# Patient Record
Sex: Female | Born: 1985 | Race: Black or African American | Hispanic: No | Marital: Single | Smoking: Never smoker
Health system: Southern US, Community
[De-identification: ages and names within clinical notes are randomized; demographics above are authoritative.]

## PROBLEM LIST (undated history)

## (undated) DIAGNOSIS — D649 Anemia, unspecified: Secondary | ICD-10-CM

## (undated) HISTORY — PX: APPENDECTOMY: SHX54

---

## 2005-11-03 ENCOUNTER — Emergency Department (HOSPITAL_COMMUNITY): Admission: EM | Admit: 2005-11-03 | Discharge: 2005-11-03 | Payer: Self-pay | Admitting: Emergency Medicine

## 2010-03-01 ENCOUNTER — Emergency Department (HOSPITAL_BASED_OUTPATIENT_CLINIC_OR_DEPARTMENT_OTHER): Admission: EM | Admit: 2010-03-01 | Discharge: 2010-03-01 | Payer: Self-pay | Admitting: Emergency Medicine

## 2010-03-01 ENCOUNTER — Ambulatory Visit: Payer: Self-pay | Admitting: Diagnostic Radiology

## 2011-02-25 LAB — LIPASE, BLOOD: Lipase: 71 U/L (ref 23–300)

## 2011-02-25 LAB — DIFFERENTIAL
Basophils Absolute: 0 10*3/uL (ref 0.0–0.1)
Basophils Relative: 0 % (ref 0–1)
Eosinophils Absolute: 0 10*3/uL (ref 0.0–0.7)
Eosinophils Relative: 0 % (ref 0–5)

## 2011-02-25 LAB — COMPREHENSIVE METABOLIC PANEL
AST: 35 U/L (ref 0–37)
Alkaline Phosphatase: 53 U/L (ref 39–117)
Calcium: 9.1 mg/dL (ref 8.4–10.5)
GFR calc non Af Amer: >60 mL/min (ref >60)
Total Protein: 8 g/dL (ref 6.0–8.3)

## 2011-02-25 LAB — URINALYSIS, ROUTINE W REFLEX MICROSCOPIC
Leukocytes, UA: NEGATIVE
Protein, ur: NEGATIVE mg/dL
Specific Gravity, Urine: 1.017 (ref 1.005–1.030)
Urobilinogen, UA: 0.2 mg/dL (ref 0.0–1.0)
pH: 5.5 (ref 5.0–8.0)

## 2011-02-25 LAB — CBC
HCT: 33.8 % — ABNORMAL LOW (ref 36.0–46.0)
MCV: 74.1 fL — ABNORMAL LOW (ref 78.0–100.0)
RBC: 4.56 MIL/uL (ref 3.87–5.11)
RDW: 15.6 % — ABNORMAL HIGH (ref 11.5–15.5)
WBC: 12.8 10*3/uL — ABNORMAL HIGH (ref 4.0–10.5)

## 2011-02-25 LAB — PREGNANCY, URINE: Preg Test, Ur: NEGATIVE

## 2011-02-25 LAB — URINE MICROSCOPIC-ADD ON

## 2012-01-24 ENCOUNTER — Encounter (HOSPITAL_BASED_OUTPATIENT_CLINIC_OR_DEPARTMENT_OTHER): Payer: Self-pay | Admitting: *Deleted

## 2012-01-24 ENCOUNTER — Emergency Department (INDEPENDENT_AMBULATORY_CARE_PROVIDER_SITE_OTHER): Payer: Managed Care, Other (non HMO)

## 2012-01-24 ENCOUNTER — Emergency Department (HOSPITAL_BASED_OUTPATIENT_CLINIC_OR_DEPARTMENT_OTHER)
Admission: EM | Admit: 2012-01-24 | Discharge: 2012-01-24 | Disposition: A | Discharge status: Home / Self Care | Payer: Managed Care, Other (non HMO) | Attending: Emergency Medicine | Admitting: Emergency Medicine

## 2012-01-24 DIAGNOSIS — R509 Fever, unspecified: Secondary | ICD-10-CM | POA: Insufficient documentation

## 2012-01-24 DIAGNOSIS — J069 Acute upper respiratory infection, unspecified: Secondary | ICD-10-CM

## 2012-01-24 DIAGNOSIS — R05 Cough: Secondary | ICD-10-CM

## 2012-01-24 HISTORY — DX: Anemia, unspecified: D64.9

## 2012-01-24 MED ORDER — IBUPROFEN 800 MG PO TABS
800.0000 mg | ORAL_TABLET | Freq: Once | ORAL | Status: AC
  Administered 2012-01-24: 800 mg via ORAL

## 2012-01-24 MED ORDER — IBUPROFEN 800 MG PO TABS
ORAL_TABLET | ORAL | Status: AC
  Filled 2012-01-24: qty 1

## 2012-01-24 NOTE — ED Notes (Signed)
Pt starts that yesterday she started having a fever that has continued today.

## 2012-01-24 NOTE — Discharge Instructions (Signed)

## 2012-01-24 NOTE — ED Provider Notes (Signed)
History     CSN: [id number]  Arrival date & time 01/24/12  1846   First MD Initiated Contact with Patient 01/24/12 1950      Chief Complaint  Patient presents with  . Fever    (Consider location/radiation/quality/duration/timing/severity/associated sxs/prior treatment) Patient is a 26 y.o. female presenting with fever. The history is provided by the patient.  Fever Primary symptoms of the febrile illness include fever.   patient presents 24 hours if fever chills sore throat myalgias.  She denies dysuria.  She denies vaginal discharge.  She denies abdominal pain.  She denies nausea vomiting and diarrhea.  She has no productive cough or shortness of breath.  She reports getting home from school developing severe chills and right wrist and thus came to the ER for evaluation.  She is without prior medical problems.  She's not tried any medicine prior to arrival.  She reports she's been eating and drinking normally.  Past Medical History  Diagnosis Date  . Anemia     History reviewed. No pertinent past surgical history.  History reviewed. No pertinent family history.  History  Substance Use Topics  . Smoking status: Never Smoker   . Smokeless tobacco: Not on file  . Alcohol Use: Yes    OB History    Grav Para Term Preterm Abortions TAB SAB Ect Mult Living                  Review of Systems  Constitutional: Positive for fever.  All other systems reviewed and are negative.    Allergies  Sulfa antibiotics  Home Medications   Current Outpatient Rx  Name Route Sig Dispense Refill  . NAPROXEN SODIUM 220 MG PO TABS Oral Take 440 mg by mouth 2 (two) times daily with a meal. As needed for menstrual cramps.    Marland Kitchen PSEUDOEPHEDRINE-APAP-DM 60-1000-30 MG/30ML PO LIQD Oral Take 30 mLs by mouth daily as needed. As needed for flu symptoms.      BP 119/74  Pulse 91  Temp(Src) 98.7 F (37.1 C) (Oral)  Resp 18  SpO2 100%  LMP 01/22/2012  Physical Exam  Nursing note and  vitals reviewed. Constitutional: She is oriented to person, place, and time. She appears well-developed and well-nourished. No distress.  HENT:  Head: Normocephalic and atraumatic.       Posterior pharynx is normal.  Uvula is midline.  No tonsillar swelling.  No evidence of peritonsillar abscess  Eyes: EOM are normal.  Neck: Normal range of motion.  Cardiovascular: Normal rate, regular rhythm and normal heart sounds.   Pulmonary/Chest: Effort normal and breath sounds normal.  Abdominal: Soft. She exhibits no distension. There is no tenderness.  Musculoskeletal: Normal range of motion.  Neurological: She is alert and oriented to person, place, and time.  Skin: Skin is warm and dry.  Psychiatric: She has a normal mood and affect. Judgment normal.    ED Course  Procedures (including critical care time)  Labs Reviewed - No data to display Dg Chest 2 View  01/24/2012  *RADIOLOGY REPORT*  Clinical Data: 26 year old female with cough and fever.  CHEST - 2 VIEW  Comparison: None  Findings: The cardiomediastinal silhouette is unremarkable. There is no evidence of focal airspace disease, pulmonary edema, suspicious pulmonary nodule/mass, pleural effusion, or pneumothorax. No acute bony abnormalities are identified.  IMPRESSION: No evidence of active cardiopulmonary disease.  Original Report Authenticated By: Rosendo Gros, M.D.     1. Upper respiratory tract infection  MDM  Likely viral upper respiratory tract infections.  The patient is well-appearing.  She is nontoxic.  No hypoxia on exam.  Lung exam is clear.  Normal work of breathing.  No indication for chest x-ray.  Close followup with PCP         Lyanne Co, MD 01/24/12 2134

## 2012-01-29 ENCOUNTER — Encounter (HOSPITAL_COMMUNITY): Admission: EM | Disposition: A | Discharge status: Home Health | Payer: Self-pay | Source: Home / Self Care

## 2012-01-29 ENCOUNTER — Encounter (HOSPITAL_COMMUNITY): Payer: Self-pay | Admitting: Anesthesiology

## 2012-01-29 ENCOUNTER — Encounter (HOSPITAL_BASED_OUTPATIENT_CLINIC_OR_DEPARTMENT_OTHER): Payer: Self-pay | Admitting: *Deleted

## 2012-01-29 ENCOUNTER — Emergency Department (HOSPITAL_COMMUNITY): Payer: Managed Care, Other (non HMO) | Admitting: Anesthesiology

## 2012-01-29 ENCOUNTER — Inpatient Hospital Stay (HOSPITAL_BASED_OUTPATIENT_CLINIC_OR_DEPARTMENT_OTHER)
Admission: EM | Admit: 2012-01-29 | Discharge: 2012-02-19 | DRG: 330 | Disposition: A | Discharge status: Home Health | Payer: Managed Care, Other (non HMO) | Attending: General Surgery | Admitting: General Surgery

## 2012-01-29 ENCOUNTER — Emergency Department (HOSPITAL_BASED_OUTPATIENT_CLINIC_OR_DEPARTMENT_OTHER)
Admission: EM | Admit: 2012-01-29 | Discharge: 2012-01-29 | Disposition: A | Discharge status: Home / Self Care | Payer: Managed Care, Other (non HMO)

## 2012-01-29 ENCOUNTER — Ambulatory Visit (INDEPENDENT_AMBULATORY_CARE_PROVIDER_SITE_OTHER)
Admission: RE | Admit: 2012-01-29 | Discharge: 2012-01-29 | Disposition: A | Discharge status: Home / Self Care | Payer: Managed Care, Other (non HMO) | Source: Ambulatory Visit | Attending: Family Medicine | Admitting: Family Medicine

## 2012-01-29 ENCOUNTER — Other Ambulatory Visit (HOSPITAL_BASED_OUTPATIENT_CLINIC_OR_DEPARTMENT_OTHER): Payer: Self-pay | Admitting: Family Medicine

## 2012-01-29 DIAGNOSIS — R509 Fever, unspecified: Secondary | ICD-10-CM

## 2012-01-29 DIAGNOSIS — R110 Nausea: Secondary | ICD-10-CM | POA: Diagnosis not present

## 2012-01-29 DIAGNOSIS — N83209 Unspecified ovarian cyst, unspecified side: Secondary | ICD-10-CM | POA: Diagnosis present

## 2012-01-29 DIAGNOSIS — K35209 Acute appendicitis with generalized peritonitis, without abscess, unspecified as to perforation: Principal | ICD-10-CM | POA: Diagnosis present

## 2012-01-29 DIAGNOSIS — D72829 Elevated white blood cell count, unspecified: Secondary | ICD-10-CM | POA: Diagnosis present

## 2012-01-29 DIAGNOSIS — D62 Acute posthemorrhagic anemia: Secondary | ICD-10-CM | POA: Diagnosis not present

## 2012-01-29 DIAGNOSIS — K3533 Acute appendicitis with perforation and localized peritonitis, with abscess: Secondary | ICD-10-CM

## 2012-01-29 DIAGNOSIS — D649 Anemia, unspecified: Secondary | ICD-10-CM | POA: Diagnosis not present

## 2012-01-29 DIAGNOSIS — N289 Disorder of kidney and ureter, unspecified: Secondary | ICD-10-CM | POA: Diagnosis not present

## 2012-01-29 DIAGNOSIS — R1031 Right lower quadrant pain: Secondary | ICD-10-CM

## 2012-01-29 DIAGNOSIS — IMO0002 Reserved for concepts with insufficient information to code with codable children: Secondary | ICD-10-CM | POA: Diagnosis not present

## 2012-01-29 DIAGNOSIS — R109 Unspecified abdominal pain: Secondary | ICD-10-CM

## 2012-01-29 DIAGNOSIS — R188 Other ascites: Secondary | ICD-10-CM

## 2012-01-29 DIAGNOSIS — Y836 Removal of other organ (partial) (total) as the cause of abnormal reaction of the patient, or of later complication, without mention of misadventure at the time of the procedure: Secondary | ICD-10-CM | POA: Diagnosis not present

## 2012-01-29 DIAGNOSIS — N801 Endometriosis of ovary: Secondary | ICD-10-CM | POA: Diagnosis present

## 2012-01-29 DIAGNOSIS — J90 Pleural effusion, not elsewhere classified: Secondary | ICD-10-CM | POA: Diagnosis not present

## 2012-01-29 DIAGNOSIS — K567 Ileus, unspecified: Secondary | ICD-10-CM | POA: Diagnosis not present

## 2012-01-29 DIAGNOSIS — R112 Nausea with vomiting, unspecified: Secondary | ICD-10-CM | POA: Insufficient documentation

## 2012-01-29 DIAGNOSIS — N939 Abnormal uterine and vaginal bleeding, unspecified: Secondary | ICD-10-CM | POA: Diagnosis not present

## 2012-01-29 DIAGNOSIS — K560 Paralytic ileus: Secondary | ICD-10-CM | POA: Diagnosis not present

## 2012-01-29 DIAGNOSIS — K559 Vascular disorder of intestine, unspecified: Secondary | ICD-10-CM | POA: Diagnosis present

## 2012-01-29 DIAGNOSIS — N80109 Endometriosis of ovary, unspecified side, unspecified depth: Secondary | ICD-10-CM | POA: Diagnosis present

## 2012-01-29 DIAGNOSIS — R935 Abnormal findings on diagnostic imaging of other abdominal regions, including retroperitoneum: Secondary | ICD-10-CM

## 2012-01-29 DIAGNOSIS — K352 Acute appendicitis with generalized peritonitis, without abscess: Principal | ICD-10-CM | POA: Diagnosis present

## 2012-01-29 HISTORY — PX: APPLICATION OF WOUND VAC: SHX5189

## 2012-01-29 HISTORY — PX: LAPAROTOMY: SHX154

## 2012-01-29 HISTORY — PX: LAPAROSCOPY: SHX197

## 2012-01-29 LAB — CBC
MCHC: 33 g/dL (ref 30.0–36.0)
MCV: 66.1 fL — ABNORMAL LOW (ref 78.0–100.0)
RBC: 4.36 MIL/uL (ref 3.87–5.11)
RDW: 14.5 % (ref 11.5–15.5)
WBC: 17.6 10*3/uL — ABNORMAL HIGH (ref 4.0–10.5)

## 2012-01-29 LAB — COMPREHENSIVE METABOLIC PANEL
ALT: 11 U/L (ref 0–35)
AST: 17 U/L (ref 0–37)
Calcium: 9.3 mg/dL (ref 8.4–10.5)
GFR calc non Af Amer: >90 mL/min (ref >90)
Sodium: 131 mEq/L — ABNORMAL LOW (ref 135–145)

## 2012-01-29 LAB — PREGNANCY, URINE: Preg Test, Ur: NEGATIVE

## 2012-01-29 SURGERY — LAPAROSCOPY, DIAGNOSTIC
Anesthesia: General | Site: Abdomen | Wound class: Dirty or Infected

## 2012-01-29 SURGERY — APPENDECTOMY, LAPAROSCOPIC
Anesthesia: General

## 2012-01-29 MED ORDER — LACTATED RINGERS IV SOLN
INTRAVENOUS | Status: DC | PRN
  Administered 2012-01-29 (×2): via INTRAVENOUS

## 2012-01-29 MED ORDER — LACTATED RINGERS IV SOLN: INTRAVENOUS | Status: DC

## 2012-01-29 MED ORDER — SODIUM CHLORIDE 0.9 % IV SOLN
1.0000 g | INTRAVENOUS | Status: DC | PRN
  Administered 2012-01-29: 1 g via INTRAVENOUS

## 2012-01-29 MED ORDER — ONDANSETRON HCL 4 MG/2ML IJ SOLN
INTRAMUSCULAR | Status: AC
  Filled 2012-01-29: qty 2

## 2012-01-29 MED ORDER — LACTATED RINGERS IR SOLN
Status: DC | PRN
  Administered 2012-01-29: 3000 mL

## 2012-01-29 MED ORDER — MORPHINE SULFATE (PF) 1 MG/ML IV SOLN
INTRAVENOUS | Status: DC
  Administered 2012-01-29 – 2012-01-30 (×2): via INTRAVENOUS
  Administered 2012-01-30: 14.5 mg via INTRAVENOUS
  Administered 2012-01-30: 8.07 mg via INTRAVENOUS
  Administered 2012-01-30: 18.94 mg via INTRAVENOUS
  Administered 2012-01-30: 19.3 mg via INTRAVENOUS
  Administered 2012-01-30: 16.5 mg via INTRAVENOUS
  Administered 2012-01-30: 13:00:00 via INTRAVENOUS
  Administered 2012-01-31: 1.5 mg via INTRAVENOUS
  Administered 2012-01-31: 6 mg via INTRAVENOUS
  Administered 2012-01-31: 7.33 mg via INTRAVENOUS
  Administered 2012-01-31 (×2): 3 mg via INTRAVENOUS
  Administered 2012-01-31: 17:00:00 via INTRAVENOUS
  Administered 2012-01-31 – 2012-02-01 (×2): 3 mg via INTRAVENOUS
  Administered 2012-02-01: 12 mg via INTRAVENOUS
  Administered 2012-02-01: 3 mg via INTRAVENOUS
  Administered 2012-02-01: 12:00:00 via INTRAVENOUS
  Administered 2012-02-01: 7.5 mg via INTRAVENOUS
  Administered 2012-02-01: 10.5 mg via INTRAVENOUS
  Administered 2012-02-01: 4.5 mg via INTRAVENOUS
  Administered 2012-02-02: 7.5 mg via INTRAVENOUS
  Administered 2012-02-02: 6 mg via INTRAVENOUS
  Administered 2012-02-02: 10:00:00 via INTRAVENOUS
  Administered 2012-02-02: 6 mg via INTRAVENOUS
  Administered 2012-02-02: 10.5 mg via INTRAVENOUS
  Administered 2012-02-02: 6 mg via INTRAVENOUS
  Administered 2012-02-03: 0.8 mg via INTRAVENOUS
  Administered 2012-02-03: 1.5 mg via INTRAVENOUS
  Administered 2012-02-03: 9 mg via INTRAVENOUS
  Administered 2012-02-03: 18:00:00 via INTRAVENOUS
  Administered 2012-02-03: 4.7 mg via INTRAVENOUS
  Administered 2012-02-03: 4.5 mg via INTRAVENOUS
  Administered 2012-02-03: 4.99 mg via INTRAVENOUS
  Administered 2012-02-03 – 2012-02-04 (×2): 1.5 mg via INTRAVENOUS
  Administered 2012-02-04: 4.5 mg via INTRAVENOUS
  Filled 2012-01-29 (×9): qty 25

## 2012-01-29 MED ORDER — HYDROMORPHONE HCL PF 1 MG/ML IJ SOLN
INTRAMUSCULAR | Status: DC | PRN
  Administered 2012-01-29 (×5): .4 mg via INTRAVENOUS

## 2012-01-29 MED ORDER — SODIUM CHLORIDE 0.9 % IV BOLUS (SEPSIS)
1000.0000 mL | Freq: Once | INTRAVENOUS | Status: AC
  Administered 2012-01-29: 1000 mL via INTRAVENOUS

## 2012-01-29 MED ORDER — NEOSTIGMINE METHYLSULFATE 1 MG/ML IJ SOLN
INTRAMUSCULAR | Status: DC | PRN
  Administered 2012-01-29: 4 mg via INTRAVENOUS

## 2012-01-29 MED ORDER — SODIUM CHLORIDE 0.9 % IV SOLN
1.0000 g | INTRAVENOUS | Status: DC
  Filled 2012-01-29: qty 1

## 2012-01-29 MED ORDER — BUPIVACAINE-EPINEPHRINE 0.25% -1:200000 IJ SOLN
INTRAMUSCULAR | Status: DC | PRN
  Administered 2012-01-29: 17 mL

## 2012-01-29 MED ORDER — ONDANSETRON HCL 4 MG/2ML IJ SOLN
4.0000 mg | Freq: Once | INTRAMUSCULAR | Status: AC
  Administered 2012-01-29: 4 mg via INTRAVENOUS

## 2012-01-29 MED ORDER — HYDROMORPHONE HCL PF 1 MG/ML IJ SOLN
INTRAMUSCULAR | Status: AC
  Filled 2012-01-29: qty 1

## 2012-01-29 MED ORDER — ACETAMINOPHEN 10 MG/ML IV SOLN
INTRAVENOUS | Status: DC | PRN
  Administered 2012-01-29: 1000 mg via INTRAVENOUS

## 2012-01-29 MED ORDER — HYDROMORPHONE HCL PF 1 MG/ML IJ SOLN
1.0000 mg | Freq: Once | INTRAMUSCULAR | Status: AC
  Administered 2012-01-29: 1 mg via INTRAVENOUS

## 2012-01-29 MED ORDER — MORPHINE SULFATE (PF) 1 MG/ML IV SOLN
INTRAVENOUS | Status: AC
  Filled 2012-01-29: qty 25

## 2012-01-29 MED ORDER — BUPIVACAINE-EPINEPHRINE 0.25% -1:200000 IJ SOLN
INTRAMUSCULAR | Status: AC
  Filled 2012-01-29: qty 1

## 2012-01-29 MED ORDER — ACETAMINOPHEN 650 MG RE SUPP
650.0000 mg | Freq: Once | RECTAL | Status: AC
  Administered 2012-01-29: 650 mg via RECTAL

## 2012-01-29 MED ORDER — LACTATED RINGERS IV SOLN
INTRAVENOUS | Status: DC | PRN
  Administered 2012-01-29 (×2): via INTRAVENOUS

## 2012-01-29 MED ORDER — SODIUM CHLORIDE 0.9 % IV SOLN
INTRAVENOUS | Status: AC
  Filled 2012-01-29: qty 1

## 2012-01-29 MED ORDER — PROMETHAZINE HCL 25 MG/ML IJ SOLN: 6.2500 mg | INTRAMUSCULAR | Status: DC | PRN

## 2012-01-29 MED ORDER — LIDOCAINE HCL (CARDIAC) 20 MG/ML IV SOLN
INTRAVENOUS | Status: DC | PRN
  Administered 2012-01-29: 100 mg via INTRAVENOUS

## 2012-01-29 MED ORDER — PROPOFOL 10 MG/ML IV EMUL
INTRAVENOUS | Status: DC | PRN
  Administered 2012-01-29: 180 mg via INTRAVENOUS

## 2012-01-29 MED ORDER — PROMETHAZINE HCL 25 MG/ML IJ SOLN
12.5000 mg | Freq: Once | INTRAMUSCULAR | Status: DC
  Filled 2012-01-29: qty 1

## 2012-01-29 MED ORDER — ONDANSETRON HCL 4 MG/2ML IJ SOLN
INTRAMUSCULAR | Status: DC | PRN
  Administered 2012-01-29: 4 mg via INTRAVENOUS

## 2012-01-29 MED ORDER — GLYCOPYRROLATE 0.2 MG/ML IJ SOLN
INTRAMUSCULAR | Status: DC | PRN
  Administered 2012-01-29: .6 mg via INTRAVENOUS

## 2012-01-29 MED ORDER — PIPERACILLIN-TAZOBACTAM 3.375 G IVPB
3.3750 g | Freq: Once | INTRAVENOUS | Status: AC
  Administered 2012-01-29: 3.375 g via INTRAVENOUS
  Filled 2012-01-29: qty 50

## 2012-01-29 MED ORDER — ACETAMINOPHEN 650 MG RE SUPP
RECTAL | Status: AC
  Filled 2012-01-29: qty 1

## 2012-01-29 MED ORDER — DEXAMETHASONE SODIUM PHOSPHATE 4 MG/ML IJ SOLN
INTRAMUSCULAR | Status: DC | PRN
  Administered 2012-01-29: 10 mg via INTRAVENOUS

## 2012-01-29 MED ORDER — FENTANYL CITRATE 0.05 MG/ML IJ SOLN
INTRAMUSCULAR | Status: DC | PRN
  Administered 2012-01-29: 50 ug via INTRAVENOUS
  Administered 2012-01-29 (×2): 100 ug via INTRAVENOUS
  Administered 2012-01-29 (×3): 50 ug via INTRAVENOUS

## 2012-01-29 MED ORDER — ACETAMINOPHEN 10 MG/ML IV SOLN
INTRAVENOUS | Status: AC
  Filled 2012-01-29: qty 100

## 2012-01-29 MED ORDER — 0.9 % SODIUM CHLORIDE (POUR BTL) OPTIME
TOPICAL | Status: DC | PRN
  Administered 2012-01-29: 3000 mL

## 2012-01-29 MED ORDER — CISATRACURIUM BESYLATE 2 MG/ML IV SOLN
INTRAVENOUS | Status: DC | PRN
  Administered 2012-01-29: 2 mg via INTRAVENOUS
  Administered 2012-01-29: 4 mg via INTRAVENOUS
  Administered 2012-01-29: 5 mg via INTRAVENOUS
  Administered 2012-01-29: 3 mg via INTRAVENOUS
  Administered 2012-01-29: 2 mg via INTRAVENOUS

## 2012-01-29 MED ORDER — MEPERIDINE HCL 25 MG/ML IJ SOLN: 6.2500 mg | INTRAMUSCULAR | Status: DC | PRN

## 2012-01-29 MED ORDER — IOHEXOL 300 MG/ML  SOLN
100.0000 mL | Freq: Once | INTRAMUSCULAR | Status: AC | PRN
  Administered 2012-01-29: 100 mL via INTRAVENOUS

## 2012-01-29 MED ORDER — HYDROMORPHONE HCL PF 1 MG/ML IJ SOLN
0.2500 mg | INTRAMUSCULAR | Status: DC | PRN
  Administered 2012-01-29 (×2): 0.5 mg via INTRAVENOUS

## 2012-01-29 MED ORDER — SODIUM CHLORIDE 0.9 % IV SOLN
Freq: Once | INTRAVENOUS | Status: AC
  Administered 2012-01-29: 15:00:00 via INTRAVENOUS

## 2012-01-29 MED ORDER — SUCCINYLCHOLINE CHLORIDE 20 MG/ML IJ SOLN
INTRAMUSCULAR | Status: DC | PRN
  Administered 2012-01-29: 80 mg via INTRAVENOUS

## 2012-01-29 SURGICAL SUPPLY — 69 items
ADH SKN CLS APL DERMABOND .7 (GAUZE/BANDAGES/DRESSINGS)
APPLIER CLIP ROT 10 11.4 M/L (STAPLE)
APR CLP MED LRG 11.4X10 (STAPLE)
BAG SPEC RTRVL LRG 6X4 10 (ENDOMECHANICALS)
BLADE EXTENDED COATED 6.5IN (ELECTRODE) ×5 IMPLANT
CANISTER SUCTION 2500CC (MISCELLANEOUS) ×5 IMPLANT
CLIP APPLIE ROT 10 11.4 M/L (STAPLE) IMPLANT
CLOTH BEACON ORANGE TIMEOUT ST (SAFETY) ×5 IMPLANT
CUTTER FLEX LINEAR 45M (STAPLE) IMPLANT
DECANTER SPIKE VIAL GLASS SM (MISCELLANEOUS) ×5 IMPLANT
DERMABOND ADVANCED (GAUZE/BANDAGES/DRESSINGS)
DERMABOND ADVANCED .7 DNX12 (GAUZE/BANDAGES/DRESSINGS) IMPLANT
DRAIN CHANNEL 19F RND (DRAIN) ×5 IMPLANT
DRAPE LAPAROSCOPIC ABDOMINAL (DRAPES) ×5 IMPLANT
DRAPE WARM FLUID 44X44 (DRAPE) ×5 IMPLANT
DRSG VAC ATS SM SENSATRAC (GAUZE/BANDAGES/DRESSINGS) ×5 IMPLANT
ELECT CAUTERY BLADE 6.4 (BLADE) ×5 IMPLANT
ELECT REM PT RETURN 9FT ADLT (ELECTROSURGICAL) ×5
ELECTRODE REM PT RTRN 9FT ADLT (ELECTROSURGICAL) ×4 IMPLANT
ENDOLOOP SUT PDS II  0 18 (SUTURE)
ENDOLOOP SUT PDS II 0 18 (SUTURE) IMPLANT
EVACUATOR SILICONE 100CC (DRAIN) ×5 IMPLANT
GLOVE BIO SURGEON STRL SZ 6.5 (GLOVE) ×10 IMPLANT
GLOVE BIO SURGEON STRL SZ7.5 (GLOVE) ×10 IMPLANT
GLOVE EUDERMIC 7 POWDERFREE (GLOVE) ×10 IMPLANT
GLOVE SURG SS PI 8.0 STRL IVOR (GLOVE) ×15 IMPLANT
GLOVE SURG SS PI 8.5 STRL IVOR (GLOVE) ×4
GLOVE SURG SS PI 8.5 STRL STRW (GLOVE) ×16 IMPLANT
GOWN PREVENTION PLUS XLARGE (GOWN DISPOSABLE) IMPLANT
GOWN STRL NON-REIN LRG LVL3 (GOWN DISPOSABLE) ×10 IMPLANT
GOWN STRL REIN XL XLG (GOWN DISPOSABLE) ×20 IMPLANT
HAND ACTIVATED (MISCELLANEOUS) ×5 IMPLANT
IV LACTATED RINGER IRRG 3000ML (IV SOLUTION) ×1
IV LACTATED RINGERS 1000ML (IV SOLUTION) IMPLANT
IV LR IRRIG 3000ML ARTHROMATIC (IV SOLUTION) ×4 IMPLANT
KIT BASIN OR (CUSTOM PROCEDURE TRAY) ×5 IMPLANT
NS IRRIG 1000ML POUR BTL (IV SOLUTION) ×25 IMPLANT
PENCIL BUTTON HOLSTER BLD 10FT (ELECTRODE) ×5 IMPLANT
POUCH SPECIMEN RETRIEVAL 10MM (ENDOMECHANICALS) IMPLANT
RELOAD 45 VASCULAR/THIN (ENDOMECHANICALS) IMPLANT
RELOAD PROXIMATE 75MM BLUE (ENDOMECHANICALS) ×15 IMPLANT
RELOAD STAPLE TA45 3.5 REG BLU (ENDOMECHANICALS) IMPLANT
SET IRRIG TUBING LAPAROSCOPIC (IRRIGATION / IRRIGATOR) ×5 IMPLANT
SOLUTION ANTI FOG 6CC (MISCELLANEOUS) ×5 IMPLANT
SPONGE DRAIN TRACH 4X4 STRL 2S (GAUZE/BANDAGES/DRESSINGS) ×5 IMPLANT
SPONGE LAP 18X18 X RAY DECT (DISPOSABLE) ×15 IMPLANT
STAPLER GUN LINEAR PROX 60 (STAPLE) ×5 IMPLANT
STAPLER PROXIMATE 75MM BLUE (STAPLE) ×5 IMPLANT
SUCTION POOLE TIP (SUCTIONS) ×5 IMPLANT
SUT ETHILON 2 0 PS N (SUTURE) ×5 IMPLANT
SUT MNCRL AB 4-0 PS2 18 (SUTURE) IMPLANT
SUT MON AB 3-0 SH 27 (SUTURE) ×4
SUT MON AB 3-0 SH27 (SUTURE) ×8 IMPLANT
SUT PDS AB 1 TP1 96 (SUTURE) ×10 IMPLANT
SUT SILK 2 0 (SUTURE) ×2
SUT SILK 2 0 SH CR/8 (SUTURE) ×5 IMPLANT
SUT SILK 2-0 18XBRD TIE 12 (SUTURE) ×4 IMPLANT
SUT SILK 3 0 (SUTURE) ×2
SUT SILK 3-0 18XBRD TIE 12 (SUTURE) ×4 IMPLANT
TOWEL OR 17X26 10 PK STRL BLUE (TOWEL DISPOSABLE) ×10 IMPLANT
TRAY FOLEY CATH 14FRSI W/METER (CATHETERS) ×5 IMPLANT
TRAY LAP CHOLE (CUSTOM PROCEDURE TRAY) ×5 IMPLANT
TROCAR BLADELESS OPT 5 75 (ENDOMECHANICALS) ×5 IMPLANT
TROCAR XCEL 12X100 BLDLESS (ENDOMECHANICALS) IMPLANT
TROCAR XCEL BLUNT TIP 100MML (ENDOMECHANICALS) ×5 IMPLANT
TROCAR XCEL NON-BLD 11X100MML (ENDOMECHANICALS) ×5 IMPLANT
TUBING INSUFFLATION 10FT LAP (TUBING) ×5 IMPLANT
YANKAUER SUCT BULB TIP 10FT TU (MISCELLANEOUS) ×5 IMPLANT
YANKAUER SUCT BULB TIP NO VENT (SUCTIONS) ×5 IMPLANT

## 2012-01-29 NOTE — Transfer of Care (Signed)
Immediate Anesthesia Transfer of Care Note  Patient: [name] [name]  Procedure(s) Performed: Procedure(s) (LRB): LAPAROSCOPY DIAGNOSTIC (N/A) EXPLORATORY LAPAROTOMY (N/A) UNILATERAL SALPINGECTOMY (Left) APPLICATION OF WOUND VAC ()  Patient Location: PACU  Anesthesia Type: General  Level of Consciousness: sedated  Airway & Oxygen Therapy: Patient Spontanous Breathing and Patient connected to face mask oxygen  Post-op Assessment: Report given to PACU RN and Post -op Vital signs reviewed and stable  Post vital signs: Reviewed and stable  Complications: No apparent anesthesia complications

## 2012-01-29 NOTE — Preoperative (Signed)
Beta Blockers   Reason not to administer Beta Blockers:Not Applicable 

## 2012-01-29 NOTE — Anesthesia Preprocedure Evaluation (Signed)
Anesthesia Evaluation  Patient identified by MRN, date of birth, ID band Patient awake    Reviewed: Allergy & Precautions, H&P , NPO status , Patient's Chart, lab work & pertinent test results, reviewed documented beta blocker date and time   Airway Mallampati: II TM Distance: >3 FB Neck ROM: full    Dental No notable dental hx.    Pulmonary neg pulmonary ROS,  clear to auscultation  Pulmonary exam normal       Cardiovascular Exercise Tolerance: Good neg cardio ROS regular Normal    Neuro/Psych Negative Neurological ROS  Negative Psych ROS   GI/Hepatic negative GI ROS, Neg liver ROS,   Endo/Other  Negative Endocrine ROS  Renal/GU negative Renal ROS  Genitourinary negative   Musculoskeletal   Abdominal   Peds  Hematology negative hematology ROS (+)   Anesthesia Other Findings   Reproductive/Obstetrics negative OB ROS                           Anesthesia Physical Anesthesia Plan  ASA: II  Anesthesia Plan: General   Post-op Pain Management:    Induction:   Airway Management Planned:   Additional Equipment:   Intra-op Plan:   Post-operative Plan:   Informed Consent: I have reviewed the patients History and Physical, chart, labs and discussed the procedure including the risks, benefits and alternatives for the proposed anesthesia with the patient or authorized representative who has indicated his/her understanding and acceptance.   Dental Advisory Given  Plan Discussed with: CRNA  Anesthesia Plan Comments:         Anesthesia Quick Evaluation  

## 2012-01-29 NOTE — H&P (Signed)
[name] [name] is an 26 y.o. female.   Chief Complaint: abd pain HPI: 25yo bf started with flu like symptoms last wed. She took tamiflu today and subsequently vomited. At this point she started having abd pain. She describes it as all over. She underwent CT today which was suggestive of perforated appendicitis with a complex cystic area in pelvis. Not clear whether this is abscess or gyn in nature.  Past Medical History  Diagnosis Date  . Anemia     History reviewed. No pertinent past surgical history.  History reviewed. No pertinent family history. Social History:  reports that she has never smoked. She does not have any smokeless tobacco history on file. She reports that she drinks alcohol. Her drug history not on file.  Allergies:  Allergies  Allergen Reactions  . Sulfa Antibiotics Hives    Medications Prior to Admission  Medication Dose Route Frequency Provider Last Rate Last Dose  . 0.9 %  sodium chloride infusion   Intravenous Once [place], [place] 250 mL/hr at 01/29/12 1509    . acetaminophen (TYLENOL) suppository 650 mg  650 mg Rectal Once Joya Gaskins, MD   650 mg at 01/29/12 1547  . HYDROmorphone (DILAUDID) 1 MG/ML injection           . HYDROmorphone (DILAUDID) injection 1 mg  1 mg Intravenous Once [place], [place]   1 mg at 01/29/12 1510  . HYDROmorphone (DILAUDID) injection 1 mg  1 mg Intravenous Once [place], [place]   1 mg at 01/29/12 1616  . iohexol (OMNIPAQUE) 300 MG/ML solution 100 mL  100 mL Intravenous Once PRN Medication Radiologist, MD   100 mL at 01/29/12 1350  . ondansetron (ZOFRAN) 4 MG/2ML injection           . ondansetron (ZOFRAN) injection 4 mg  4 mg Intravenous Once [place], [place]   4 mg at 01/29/12 1510  . ondansetron (ZOFRAN) injection 4 mg  4 mg Intravenous Once [place], [place]   4 mg at 01/29/12 1616  . piperacillin-tazobactam (ZOSYN) IVPB 3.375 g  3.375 g Intravenous Once Joya Gaskins, MD   3.375 g at 01/29/12 1531  . promethazine (PHENERGAN)  injection 12.5 mg  12.5 mg Intravenous Once Nelia Shi, MD      . sodium chloride 0.9 % bolus 1,000 mL  1,000 mL Intravenous Once Joya Gaskins, MD   1,000 mL at 01/29/12 1516   No current outpatient prescriptions on file as of 01/29/2012.    Results for orders placed during the hospital encounter of 01/29/12 (from the past 48 hour(s))  LACTIC ACID, PLASMA     Status: Normal   Collection Time   01/29/12  3:00 PM      Component Value Range Comment   Lactic Acid, Venous 1.6  0.5 - 2.2 (mmol/L)   CBC     Status: Abnormal   Collection Time   01/29/12  3:05 PM      Component Value Range Comment   WBC 17.6 (*) 4.0 - 10.5 (K/uL)    RBC 4.36  3.87 - 5.11 (MIL/uL)    Hemoglobin 9.5 (*) 12.0 - 15.0 (g/dL)    HCT 69.6 (*) 29.5 - 46.0 (%)    MCV 66.1 (*) 78.0 - 100.0 (fL)    MCH 21.8 (*) 26.0 - 34.0 (pg)    MCHC 33.0  30.0 - 36.0 (g/dL)    RDW 28.4  13.2 - 44.0 (%)    Platelets 422 (*)  150 - 400 (K/uL)   COMPREHENSIVE METABOLIC PANEL     Status: Abnormal   Collection Time   01/29/12  3:05 PM      Component Value Range Comment   Sodium 131 (*) 135 - 145 (mEq/L)    Potassium 3.7  3.5 - 5.1 (mEq/L)    Chloride 93 (*) 96 - 112 (mEq/L)    CO2 24  19 - 32 (mEq/L)    Glucose, Bld 130 (*) 70 - 99 (mg/dL)    BUN 11  6 - 23 (mg/dL)    Creatinine, Ser 1.61  0.50 - 1.10 (mg/dL)    Calcium 9.3  8.4 - 10.5 (mg/dL)    Total Protein 7.8  6.0 - 8.3 (g/dL)    Albumin 2.8 (*) 3.5 - 5.2 (g/dL)    AST 17  0 - 37 (U/L)    ALT 11  0 - 35 (U/L)    Alkaline Phosphatase 58  39 - 117 (U/L)    Total Bilirubin 0.8  0.3 - 1.2 (mg/dL)    GFR calc non Af Amer >90  >90 (mL/min)    GFR calc Af Amer >90  >90 (mL/min)   PREGNANCY, URINE     Status: Normal   Collection Time   01/29/12  3:27 PM      Component Value Range Comment   Preg Test, Ur NEGATIVE  NEGATIVE     Ct Abdomen Pelvis W Contrast  01/29/2012  *RADIOLOGY REPORT*  Clinical Data: Abdominal pain over the past 6 days with nausea and vomiting.   Fever.  CT ABDOMEN AND PELVIS WITH CONTRAST  Technique:  Multidetector CT imaging of the abdomen and pelvis was performed following the standard protocol during bolus administration of intravenous contrast.  Contrast:  100 ml Omnipaque-300  Comparison: 03/01/2010  Findings: Small bilateral pleural effusions are present associated with bibasilar passive atelectasis.  A small amount of perihepatic ascites is present.  The liver appears otherwise normal.  The spleen, pancreas, and adrenal glands appear normal.  The kidneys appear unremarkable, as do the proximal ureters.  Contrast medium noted in the small bowel.  This extends to the cecum.  There is extensive inflammatory phlegmon in the right lower quadrant, along with marked wall thickening in the cecum and adjacent prominent pericecal lymph nodes.  In addition to pelvic ascites, there is a complex cystic lesion above the urinary bladder measuring 12.7 ml by 9.9 by 10.8 cm. This lesion contains a more dense 4.8 x 6.2 x 6.4 cm component.  I am uncertain whether these represent abscess or ovarian lesions; these lesions are difficult to separate from the left ovary, and the right ovary is difficult to identify separate from the right lower quadrant phlegmon.  Right lower quadrant fluid along the margin of this cyst may also be demonstrating some early loculation.  The uterine contour appears unremarkable.  IMPRESSION:  1.  Severe inflammatory process in the right lower quadrant, favoring a ruptured acute appendicitis with severe inflammation of the adjacent cecum and terminal ileum. The appendix is completely obscured.  Active Crohn's disease with abscess formation obscuring the appendix is a differential diagnostic consideration.  There is ascites in the upper and lower abdomen along with small bilateral pleural effusions. Surgical assessment is recommended. 2.  New irregular complex large cystic lesions above the urinary bladder.  Although these could possibly be  ovarian in origin, I am suspicious for pelvic abscesses related to ruptured appendicitis. Endometriomas or complex ovarian cysts are a less likely  differential diagnostic consideration.  Original Report Authenticated By: Dellia Cloud, M.D.    Review of Systems  Constitutional: Positive for fever and chills.  HENT: Negative.   Eyes: Negative.   Respiratory: Negative.   Cardiovascular: Negative.   Gastrointestinal: Positive for abdominal pain.  Genitourinary: Negative.   Musculoskeletal: Positive for myalgias.  Skin: Negative.   Neurological: Negative.   Endo/Heme/Allergies: Negative.   Psychiatric/Behavioral: Negative.     Blood pressure 123/67, pulse 88, temperature 99.2 F (37.3 C), temperature source Oral, resp. rate 18, height 5\' 11"  (1.803 m), weight 218 lb (98.884 kg), last menstrual period 01/22/2012, SpO2 96.00%. Physical Exam  Constitutional: She is oriented to person, place, and time. She appears well-developed and well-nourished.  HENT:  Head: Normocephalic and atraumatic.  Eyes: Conjunctivae and EOM are normal. Pupils are equal, round, and reactive to light.  Neck: Normal range of motion. Neck supple.  Cardiovascular: Normal rate, regular rhythm and normal heart sounds.   Respiratory: Effort normal and breath sounds normal.  GI: Soft. There is tenderness.  Musculoskeletal: Normal range of motion.  Neurological: She is alert and oriented to person, place, and time.  Skin: Skin is warm and dry.  Psychiatric: She has a normal mood and affect. Her behavior is normal.     Assessment/Plan Perforated appendicitis with complex cystic lesion in pelvis. I would recommend appendectomy tonight and we will have Gynecologist available in case we need them. I have discussed with her the risk and benefits of surgery and she understands and wishes to proceed. This includes the possibility of needing and ostomy  TOTH III,Sande Pickert S 01/29/2012, 6:57 PM

## 2012-01-29 NOTE — Anesthesia Procedure Notes (Signed)
Procedure Name: Intubation Date/Time: 01/29/2012 8:17 PM Performed by: Lurlean Leyden, Amantha Sklar L. Patient Re-evaluated:Patient Re-evaluated prior to inductionOxygen Delivery Method: Circle system utilized Preoxygenation: Pre-oxygenation with 100% oxygen Intubation Type: IV induction, Cricoid Pressure applied and Rapid sequence Laryngoscope Size: Miller and 2 Grade View: Grade I Tube type: Oral Tube size: 7.5 mm Number of attempts: 1 Airway Equipment and Method: Stylet Placement Confirmation: ETT inserted through vocal cords under direct vision,  breath sounds checked- equal and bilateral and positive ETCO2 Secured at: 21 cm Tube secured with: Tape Dental Injury: Teeth and Oropharynx as per pre-operative assessment

## 2012-01-29 NOTE — Op Note (Signed)
Procedure Note  Kerstin Rogstad 01/29/2012   Pre-op Diagnosis: Perforated appendix, ovarian cyst     Post-op Diagnosis: Same  Procedure(s): See note per Dr. Carolynne Edouard Left ovarian cystectomy, salpingectomy  Surgeon(s): Robyne Askew, MD Antionette Char, MD  Anesthesia: General  Specimens: see note per Dr. Carolynne Edouard; left Fallopian tube, left ovarian cyst wall  Procedure:  Attention was diverted to the pelvis.  There was a smooth-walled, left adnexal cyst approximately 8 cm in diameter.  It was involved in filmy adhesions to to parieto-peritoneum of the posterior cul-de-sac.  The uterus, right tube and ovary appeared normal.  The cyst was elevated into the incision and a small chocolate cyst was ruptured.  The tube was splayed over the cyst, ovarian cortex.  The proximal tube was divided and suture ligated with 2-0 Monocryl.  The mesosalpinx was divided using the bovie.  During this dissection the cyst was ruptured; the contents was a serous-appearing fluid.  The cyst was dissected using blunt dissection and was removed.  The cyst bed was hemostatic.  Bleeding points on the edges of the ovarian cortex were over-sewn with running sutures of 2-0 Monocryl. The pelvis was irrigated; adequate hemostasis was noted.  The cortex was left open to heal by secondary intention.          JACKSON-MOORE,Jereme Loren A   Date: 01/29/2012  Time: 11:02 PM

## 2012-01-29 NOTE — Op Note (Signed)
01/29/2012  10:52 PM  PATIENT:  [name] [name]  26 y.o. female  PRE-OPERATIVE DIAGNOSIS:  perforated appendixcitis  POST-OPERATIVE DIAGNOSIS:  perforated appendix with terminal ileum and left fallopian tube involvement  PROCEDURE:  Procedure(s) (LRB): LAPAROSCOPY DIAGNOSTIC (N/A) EXPLORATORY LAPAROTOMY (N/A) UNILATERAL SALPINGECTOMY (Left) APPLICATION OF WOUND VAC ()  SURGEON:  Surgeon(s) and Role: Panel 1:    * Christian Leta Jungling, MD - Assisting    * Robyne Askew, MD - Primary    * Roseanna Rainbow, MD - Assisting  Panel 2:    * Roseanna Rainbow, MD - Primary  PHYSICIAN ASSISTANT:   ASSISTANTS: Dr. Jamey Ripa and Dr. Antionette Char   ANESTHESIA:   general  EBL:  Total I/O In: 2000 [I.V.:2000] Out: 450 [Urine:275; Blood:175]  BLOOD ADMINISTERED:none  DRAINS: (1) Jackson-Pratt drain(s) with closed bulb suction in the pelvis   LOCAL MEDICATIONS USED:  MARCAINE     SPECIMEN:  Source of Specimen:  terminal ileum, right colon, tube and ovarian  cyst  DISPOSITION OF SPECIMEN:  PATHOLOGY  COUNTS:  YES  TOURNIQUET:  * No tourniquets in log *  DICTATION: .Dragon Dictation After informed consent was obtained the patient was brought to the operating room and placed in the supine position on the operating room table. After adequate induction of general anesthesia the patient's abdomen was prepped with ChloraPrep, allowed to dry, and draped in usual sterile manner. Initially the area below the umbilicus was infiltrated with course of Marcaine. A small incision was made with a 15 blade knife. This incision was carried through the subcutaneous tissue bluntly with a hemostat and Army-Navy retractors until the linea alba was identified. The linea alba was incised with a 15 blade knife. Each side was grasped with Coker clamps and elevated anteriorly. The preperitoneal space was probed bluntly with a hemostat until the peritoneum was opened and access was gained to the  abdominal cavity. A 0 vicryl Purstring stitch was placed in the fascia surrounding opening. A Hassan cannula was placed through the opening and anchored in place with the previously placed Vicryl pursestring stitch. The abdomen was insufflated with carbon dioxide. A laparoscope was placed through the ascending cannula and the abdomen was inspected. The abdomen was full of pus. A 5 mm port was placed in the upper abdomen under direct vision. We were unable to identify the appendix. The patient did have a large cystic mass in the pelvis. At this point a lower midline incision was made with a 10 blade knife. The incision was opened with the electrocautery under direct vision. The ports were removed. The appendix was retrocecal and was densely stuck. In order to see the appendix we had to mobilize the right colon by incising its retroperitoneal attachment along the white line of Toldt. Once we had the right colon up in the air it was clear that we were a knot to be able to identify the attachment of the appendix to the cecum and the cecum was beginning to look ischemic. We therefore chose a spot on the terminal ileum where the small intestine appeared to be healthy. We opened the mesentery at this point bluntly with a hemostat. We placed a GIA 75 stapler across the small bowel at this point clamped and fired it thereby dividing the small bowel between staple lines. We then chose a point along the right colon where the colon appeared to be healthy. We opened the mesentery at this point bluntly with a hemostat. We placed  a GIA 75 stapler across the colon at this point clamped and fired it thereby dividing the colon between staple lines. The mesentery to this portion of small bowel and colon was then taken down by serially clamping the mesentery with Kelly clamps, dividing the mesentery, and tying it with 2-0 silk ties. Once this was accomplished we removed the terminal ileum and most of the right colon. The small bowel  and right colon easily approximated each other and appeared healthy. A small enterotomy was made on the antimesenteric border of each limb of the small bowel and colon. Each limb of the GIA-75 stapler was placed down the appropriate limb of bowel. The stapler was clamped and fired thereby creating a nice widely patent enteroenterostomy. The common enterotomy was closed with a TA 60 stapler. The staple lines were then imbricated with 2-0 silk Lembert stitches. A 2-0 silk crotch stitch was also placed. At this point the operation was turned over to Dr. Tamela Oddi for removal of the cyst. Her portion of the case will be dictated separately. Once this was accomplished we then irrigated the abdomen with copious amounts of saline. A small stab incision was made in the left midabdomen. A tonsil clamp was placed through this opening into the abdominal cavity. A 19 French round Blake drain was then brought through the abdominal wall and the drain was placed in the pelvis. The drain was anchored to the skin with a 2-0 nylon stitch. The fascia of the anterior abdominal wall was then closed with 2 running #1 double-stranded looped PDS sutures. The upper portion of this incision at the umbilicus was closed with staples. A vacuum dressing was applied to the rest of the open wound. the patient tolerated the procedure well. At the end of the case needle sponge and instrument counts were correct.the patient was then awakened and taken to recovery in stable condition.  PLAN OF CARE: Admit to inpatient   PATIENT DISPOSITION:  PACU - guarded condition.   Delay start of Pharmacological VTE agent (>24hrs) due to surgical blood loss or risk of bleeding: yes

## 2012-01-29 NOTE — ED Notes (Signed)
Patient arrived to room 23 via [facility]. Family at the bedside.

## 2012-01-29 NOTE — ED Notes (Signed)
Pt c/o abd pain x 5 days with n/v and fever, ct done dx appendicits

## 2012-01-29 NOTE — ED Provider Notes (Signed)
History     CSN: [id number]  Arrival date & time 01/29/12  1458   First MD Initiated Contact with Patient 01/29/12 1508      Chief Complaint  Patient presents with  . Abdominal Pain     Patient is a 26 y.o. female presenting with abdominal pain. The history is provided by the patient.  Abdominal Pain The primary symptoms of the illness include abdominal pain, fever, nausea and vomiting. The primary symptoms of the illness do not include diarrhea. The current episode started yesterday. The onset of the illness was gradual. The problem has been gradually worsening.  Additional symptoms associated with the illness include chills. Symptoms associated with the illness do not include back pain.  Patient reports her pain became acutely worse last night with vomiting She denies any vaginal bleeding  Past Medical History  Diagnosis Date  . Anemia     History reviewed. No pertinent past surgical history.  History reviewed. No pertinent family history.  History  Substance Use Topics  . Smoking status: Never Smoker   . Smokeless tobacco: Not on file  . Alcohol Use: Yes    OB History    Grav Para Term Preterm Abortions TAB SAB Ect Mult Living                  Review of Systems  Constitutional: Positive for fever and chills.  Gastrointestinal: Positive for nausea, vomiting and abdominal pain. Negative for diarrhea.  Musculoskeletal: Negative for back pain.  All other systems reviewed and are negative.    Allergies  Sulfa antibiotics  Home Medications   Current Outpatient Rx  Name Route Sig Dispense Refill  . OSELTAMIVIR PHOSPHATE 75 MG PO CAPS Oral Take 75 mg by mouth.      BP 111/59  Pulse 96  Temp(Src) 102.5 F (39.2 C) (Oral)  Resp 16  Ht 5\' 11"  (1.803 m)  Wt 218 lb (98.884 kg)  BMI 30.40 kg/m2  SpO2 100%  LMP 01/22/2012  Physical Exam CONSTITUTIONAL: Well developed/well nourished HEAD AND FACE: Normocephalic/atraumatic EYES: EOMI/PERRL ENMT: Mucous  membranes dry NECK: supple no meningeal signs SPINE:entire spine nontender CV: S1/S2 noted, no murmurs/rubs/gallops noted LUNGS: Lungs are clear to auscultation bilaterally, no apparent distress ABDOMEN: soft, diffuse tenderness throughout abdomen.  Her abdomen is not rigid GU:no cva tenderness NEURO: Pt is awake/alert, moves all extremitiesx4 EXTREMITIES: pulses normal, full ROM SKIN: warm, color normal PSYCH: no abnormalities of mood noted  ED Course  Procedures   Labs Reviewed  CBC - Abnormal; Notable for the following:    WBC 17.6 (*)    Hemoglobin 9.5 (*)    HCT 28.8 (*)    MCV 66.1 (*)    MCH 21.8 (*)    Platelets 422 (*)    All other components within normal limits  COMPREHENSIVE METABOLIC PANEL - Abnormal; Notable for the following:    Sodium 131 (*)    Chloride 93 (*)    Glucose, Bld 130 (*)    Albumin 2.8 (*)    All other components within normal limits  LACTIC ACID, PLASMA  PREGNANCY, URINE  CULTURE, BLOOD (ROUTINE X 2)   Ct Abdomen Pelvis W Contrast  01/29/2012  *RADIOLOGY REPORT*  Clinical Data: Abdominal pain over the past 6 days with nausea and vomiting.  Fever.  CT ABDOMEN AND PELVIS WITH CONTRAST  Technique:  Multidetector CT imaging of the abdomen and pelvis was performed following the standard protocol during bolus administration of intravenous contrast.  Contrast:  100 ml Omnipaque-300  Comparison: 03/01/2010  Findings: Small bilateral pleural effusions are present associated with bibasilar passive atelectasis.  A small amount of perihepatic ascites is present.  The liver appears otherwise normal.  The spleen, pancreas, and adrenal glands appear normal.  The kidneys appear unremarkable, as do the proximal ureters.  Contrast medium noted in the small bowel.  This extends to the cecum.  There is extensive inflammatory phlegmon in the right lower quadrant, along with marked wall thickening in the cecum and adjacent prominent pericecal lymph nodes.  In addition to  pelvic ascites, there is a complex cystic lesion above the urinary bladder measuring 12.7 ml by 9.9 by 10.8 cm. This lesion contains a more dense 4.8 x 6.2 x 6.4 cm component.  I am uncertain whether these represent abscess or ovarian lesions; these lesions are difficult to separate from the left ovary, and the right ovary is difficult to identify separate from the right lower quadrant phlegmon.  Right lower quadrant fluid along the margin of this cyst may also be demonstrating some early loculation.  The uterine contour appears unremarkable.  IMPRESSION:  1.  Severe inflammatory process in the right lower quadrant, favoring a ruptured acute appendicitis with severe inflammation of the adjacent cecum and terminal ileum. The appendix is completely obscured.  Active Crohn's disease with abscess formation obscuring the appendix is a differential diagnostic consideration.  There is ascites in the upper and lower abdomen along with small bilateral pleural effusions. Surgical assessment is recommended. 2.  New irregular complex large cystic lesions above the urinary bladder.  Although these could possibly be ovarian in origin, I am suspicious for pelvic abscesses related to ruptured appendicitis. Endometriomas or complex ovarian cysts are a less likely differential diagnostic consideration.  Original Report Authenticated By: Dellia Cloud, M.D.     1. Acute appendicitis    Discussed with dr Gaynelle Adu, surgery Requests ER to ER transfer to [facility] Requests we start zosyn He will see in the ER Pt stabilized in the ER D/w ER physician Dr Radford Pax aware of patient    MDM  Nursing notes reviewed and considered in documentation labs/vitals reviewed and considered         Joya Gaskins, MD 01/29/12 [phone]

## 2012-01-29 NOTE — ED Notes (Signed)
JXB:[id number]<GN> Expected date:01/29/12<BR> Expected time: 4:51 PM<BR> Means of arrival:Ambulance<BR> Comments:<BR> Transfer [facility]

## 2012-01-30 ENCOUNTER — Encounter (HOSPITAL_COMMUNITY): Payer: Self-pay | Admitting: General Surgery

## 2012-01-30 LAB — CBC
HCT: 27 % — ABNORMAL LOW (ref 36.0–46.0)
MCHC: 33.3 g/dL (ref 30.0–36.0)
MCV: 67.3 fL — ABNORMAL LOW (ref 78.0–100.0)
RDW: 14.6 % (ref 11.5–15.5)

## 2012-01-30 LAB — BASIC METABOLIC PANEL
BUN: 8 mg/dL (ref 6–23)
CO2: 28 mEq/L (ref 19–32)
Calcium: 8.4 mg/dL (ref 8.4–10.5)
Chloride: 102 mEq/L (ref 96–112)
Creatinine, Ser: 0.6 mg/dL (ref 0.50–1.10)

## 2012-01-30 MED ORDER — KCL IN DEXTROSE-NACL 20-5-0.9 MEQ/L-%-% IV SOLN: INTRAVENOUS | Status: DC

## 2012-01-30 MED ORDER — ERTAPENEM SODIUM 1 G IJ SOLR: 1.0000 g | INTRAMUSCULAR | Status: DC

## 2012-01-30 MED ORDER — ONDANSETRON HCL 4 MG/2ML IJ SOLN: 4.0000 mg | Freq: Four times a day (QID) | INTRAMUSCULAR | Status: DC | PRN

## 2012-01-30 MED ORDER — NALOXONE HCL 0.4 MG/ML IJ SOLN: 0.4000 mg | INTRAMUSCULAR | Status: DC | PRN

## 2012-01-30 MED ORDER — DIPHENHYDRAMINE HCL 12.5 MG/5ML PO ELIX: 12.5000 mg | ORAL_SOLUTION | Freq: Four times a day (QID) | ORAL | Status: DC | PRN

## 2012-01-30 MED ORDER — MORPHINE SULFATE 4 MG/ML IJ SOLN: 4.0000 mg | INTRAMUSCULAR | Status: DC | PRN

## 2012-01-30 MED ORDER — ONDANSETRON HCL 4 MG PO TABS
4.0000 mg | ORAL_TABLET | Freq: Four times a day (QID) | ORAL | Status: DC | PRN
  Administered 2012-02-13: 4 mg via ORAL

## 2012-01-30 MED ORDER — DIPHENHYDRAMINE HCL 50 MG/ML IJ SOLN: 12.5000 mg | Freq: Four times a day (QID) | INTRAMUSCULAR | Status: DC | PRN

## 2012-01-30 MED ORDER — PANTOPRAZOLE SODIUM 40 MG IV SOLR
40.0000 mg | Freq: Every day | INTRAVENOUS | Status: DC
  Administered 2012-01-30 – 2012-02-03 (×5): 40 mg via INTRAVENOUS
  Filled 2012-01-30 (×9): qty 40

## 2012-01-30 MED ORDER — KCL IN DEXTROSE-NACL 20-5-0.9 MEQ/L-%-% IV SOLN
INTRAVENOUS | Status: DC
  Administered 2012-01-30 (×2): via INTRAVENOUS
  Administered 2012-01-31: 1000 mL via INTRAVENOUS
  Administered 2012-01-31 – 2012-02-02 (×5): via INTRAVENOUS
  Administered 2012-02-02: 100 mL/h via INTRAVENOUS
  Administered 2012-02-03 – 2012-02-04 (×4): via INTRAVENOUS
  Filled 2012-01-30 (×18): qty 1000

## 2012-01-30 MED ORDER — SODIUM CHLORIDE 0.9 % IJ SOLN: 9.0000 mL | INTRAMUSCULAR | Status: DC | PRN

## 2012-01-30 MED ORDER — ONDANSETRON HCL 4 MG/2ML IJ SOLN
4.0000 mg | Freq: Four times a day (QID) | INTRAMUSCULAR | Status: DC | PRN
  Administered 2012-02-08 – 2012-02-19 (×19): 4 mg via INTRAVENOUS
  Filled 2012-01-30 (×21): qty 2

## 2012-01-30 MED ORDER — ERTAPENEM SODIUM 1 G IJ SOLR
1.0000 g | INTRAMUSCULAR | Status: AC
  Administered 2012-01-30 – 2012-02-05 (×7): 1 g via INTRAVENOUS
  Filled 2012-01-30 (×8): qty 1

## 2012-01-30 MED ORDER — ENOXAPARIN SODIUM 40 MG/0.4ML ~~LOC~~ SOLN
40.0000 mg | SUBCUTANEOUS | Status: DC
  Administered 2012-01-30 – 2012-02-05 (×7): 40 mg via SUBCUTANEOUS
  Filled 2012-01-30 (×9): qty 0.4

## 2012-01-30 NOTE — Anesthesia Postprocedure Evaluation (Signed)
  Anesthesia Post-op Note  Patient: [name] [name]  Procedure(s) Performed: Procedure(s) (LRB): LAPAROSCOPY DIAGNOSTIC (N/A) EXPLORATORY LAPAROTOMY (N/A) UNILATERAL SALPINGECTOMY (Left) APPLICATION OF WOUND VAC ()  Patient Location: PACU  Anesthesia Type: General  Level of Consciousness: awake and alert   Airway and Oxygen Therapy: Patient Spontanous Breathing  Post-op Pain: mild  Post-op Assessment: Post-op Vital signs reviewed, Patient's Cardiovascular Status Stable, Respiratory Function Stable, Patent Airway and No signs of Nausea or vomiting  Post-op Vital Signs: stable  Complications: No apparent anesthesia complications

## 2012-01-30 NOTE — Progress Notes (Signed)
Report to carey rn  stepdown

## 2012-01-30 NOTE — Progress Notes (Signed)
1 Day Post-Op  Subjective: Doing ok. No major complaints. No flatus. Pain ok.  Objective: Vital signs in last 24 hours: Temp:  [97.6 F (36.4 C)-102.5 F (39.2 C)] 97.6 F (36.4 C) (02/28 0400) Pulse Rate:  [63-96] 65  (02/28 0500) Resp:  [14-30] 16  (02/28 0714) BP: (111-133)/(59-78) 114/66 mmHg (02/28 0500) SpO2:  [95 %-100 %] 99 % (02/28 0714) Weight:  [218 lb (98.884 kg)] 218 lb (98.884 kg) (02/27 1459)    Intake/Output from previous day: 02/27 0701 - 02/28 0700 In: 4850 [I.V.:3400; IV Piggyback:50] Out: 900 [Urine:475; Emesis/NG output:225; Drains:25; Blood:175] Intake/Output this shift:    Alert, nad cta with decreased BS at bases Reg Soft, some distension, wound vac inplace, hypoBS. JP-blood tinged with pus  Lab Results:   Basename 01/30/12 0305 01/29/12 1505  WBC 19.3* 17.6*  HGB 9.0* 9.5*  HCT 27.0* 28.8*  PLT 365 422*   BMET  Basename 01/30/12 0305 01/29/12 1505  NA 136 131*  K 4.3 3.7  CL 102 93*  CO2 28 24  GLUCOSE 144* 130*  BUN 8 11  CREATININE 0.60 0.70  CALCIUM 8.4 9.3   PT/INR No results found for this basename: LABPROT:2,INR:2 in the last 72 hours ABG No results found for this basename: PHART:2,PCO2:2,PO2:2,HCO3:2 in the last 72 hours  Studies/Results: Ct Abdomen Pelvis W Contrast  01/29/2012  *RADIOLOGY REPORT*  Clinical Data: Abdominal pain over the past 6 days with nausea and vomiting.  Fever.  CT ABDOMEN AND PELVIS WITH CONTRAST  Technique:  Multidetector CT imaging of the abdomen and pelvis was performed following the standard protocol during bolus administration of intravenous contrast.  Contrast:  100 ml Omnipaque-300  Comparison: 03/01/2010  Findings: Small bilateral pleural effusions are present associated with bibasilar passive atelectasis.  A small amount of perihepatic ascites is present.  The liver appears otherwise normal.  The spleen, pancreas, and adrenal glands appear normal.  The kidneys appear unremarkable, as do the  proximal ureters.  Contrast medium noted in the small bowel.  This extends to the cecum.  There is extensive inflammatory phlegmon in the right lower quadrant, along with marked wall thickening in the cecum and adjacent prominent pericecal lymph nodes.  In addition to pelvic ascites, there is a complex cystic lesion above the urinary bladder measuring 12.7 ml by 9.9 by 10.8 cm. This lesion contains a more dense 4.8 x 6.2 x 6.4 cm component.  I am uncertain whether these represent abscess or ovarian lesions; these lesions are difficult to separate from the left ovary, and the right ovary is difficult to identify separate from the right lower quadrant phlegmon.  Right lower quadrant fluid along the margin of this cyst may also be demonstrating some early loculation.  The uterine contour appears unremarkable.  IMPRESSION:  1.  Severe inflammatory process in the right lower quadrant, favoring a ruptured acute appendicitis with severe inflammation of the adjacent cecum and terminal ileum. The appendix is completely obscured.  Active Crohn's disease with abscess formation obscuring the appendix is a differential diagnostic consideration.  There is ascites in the upper and lower abdomen along with small bilateral pleural effusions. Surgical assessment is recommended. 2.  New irregular complex large cystic lesions above the urinary bladder.  Although these could possibly be ovarian in origin, I am suspicious for pelvic abscesses related to ruptured appendicitis. Endometriomas or complex ovarian cysts are a less likely differential diagnostic consideration.  Original Report Authenticated By: Dellia Cloud, M.D.    Anti-infectives: Anti-infectives  Start     Dose/Rate Route Frequency Ordered Stop   01/30/12 2000   ertapenem (INVANZ) 1 g in sodium chloride 0.9 % 50 mL IVPB        1 g 100 mL/hr over 30 Minutes Intravenous Every 24 hours 01/30/12 0155     01/30/12 0154   ertapenem (INVANZ) 1 g in sodium  chloride 0.9 % 50 mL IVPB  Status:  Discontinued        1 g 100 mL/hr over 30 Minutes Intravenous Every 24 hours 01/30/12 0154 01/30/12 0207   01/29/12 2045   ertapenem (INVANZ) 1 g in sodium chloride 0.9 % 50 mL IVPB  Status:  Discontinued        1 g 100 mL/hr over 30 Minutes Intravenous On call to O.R. 01/29/12 2033 01/30/12 0209   01/29/12 1530   piperacillin-tazobactam (ZOSYN) IVPB 3.375 g        3.375 g 12.5 mL/hr over 240 Minutes Intravenous  Once 01/29/12 1526 01/29/12 1931          Assessment/Plan: s/p Procedure(s) (LRB): LAPAROSCOPY DIAGNOSTIC (N/A) EXPLORATORY LAPAROTOMY (N/A) UNILATERAL SALPINGECTOMY (Left) APPLICATION OF WOUND VAC () Ileocecectomy  Pulm -Out of bed, IS, wean O2 Gi- bowel rest, ng tube to liws Renal- uop ok. Cont foley today Anemia - hgb stable.  ID - cont IV abx secondary to ruptured appx  Mary Sella. Andrey Campanile, MD, FACS General, Bariatric, & Minimally Invasive Surgery [facility], [place]   LOS: 1 day    Atilano Ina 01/30/2012

## 2012-01-30 NOTE — Progress Notes (Signed)
1 Day Post-Op Procedure(s) (LRB): LAPAROSCOPY DIAGNOSTIC (N/A) EXPLORATORY LAPAROTOMY (N/A) UNILATERAL SALPINGECTOMY (Left) APPLICATION OF WOUND VAC ()  Subjective: Patient reports no complaints    Objective: Vital signs in last 24 hours: Temp:  [97.6 F (36.4 C)-102.5 F (39.2 C)] 97.8 F (36.6 C) (02/28 0800) Pulse Rate:  [63-96] 65  (02/28 0500) Resp:  [14-30] 22  (02/28 0801) BP: (111-133)/(59-78) 114/66 mmHg (02/28 0500) SpO2:  [95 %-100 %] 99 % (02/28 0801) Weight:  [98.884 kg (218 lb)] 98.884 kg (218 lb) (02/27 1459)    Intake/Output from previous day: 02/27 0701 - 02/28 0700 In: 4850 [I.V.:3400; IV Piggyback:50] Out: 900 [Urine:475; Emesis/NG output:225; Drains:25; Blood:175]       Labs: WBC/Hgb/Hct/Plts:  19.3/9.0/27.0/365 (02/28 0305) BUN/Cr/glu/ALT/AST/amyl/lip:  8/0.60/--/--/--/--/-- (02/28 0305)   Assessment:  26 y.o. s/p Procedure(s): LAPAROSCOPY DIAGNOSTIC EXPLORATORY LAPAROTOMY UNILATERAL SALPINGECTOMY APPLICATION OF WOUND VAC: stable  Events of surgery reviewed with the patient  Plan: Continued management per General Surgery  LOS: 1 day    JACKSON-MOORE,Riona Lahti A 01/30/2012, 8:32 AM

## 2012-01-31 LAB — BASIC METABOLIC PANEL
BUN: 11 mg/dL (ref 6–23)
CO2: 29 mEq/L (ref 19–32)
Calcium: 9.1 mg/dL (ref 8.4–10.5)
Creatinine, Ser: 0.7 mg/dL (ref 0.50–1.10)
GFR calc non Af Amer: >90 mL/min (ref >90)
Glucose, Bld: 153 mg/dL — ABNORMAL HIGH (ref 70–99)

## 2012-01-31 LAB — DIFFERENTIAL
Eosinophils Absolute: 0 10*3/uL (ref 0.0–0.7)
Eosinophils Relative: 0 % (ref 0–5)
Lymphs Abs: 1 10*3/uL (ref 0.7–4.0)
Monocytes Absolute: 1.2 10*3/uL — ABNORMAL HIGH (ref 0.1–1.0)
Neutrophils Relative %: 91 % — ABNORMAL HIGH (ref 43–77)

## 2012-01-31 LAB — CBC
MCH: 21.8 pg — ABNORMAL LOW (ref 26.0–34.0)
MCHC: 31.3 g/dL (ref 30.0–36.0)
MCV: 69.7 fL — ABNORMAL LOW (ref 78.0–100.0)
Platelets: 389 10*3/uL (ref 150–400)

## 2012-01-31 MED ORDER — SODIUM CHLORIDE 0.9 % IJ SOLN
INTRAMUSCULAR | Status: AC
  Filled 2012-01-31: qty 3

## 2012-01-31 NOTE — Progress Notes (Signed)
Addressed pt's father's concerns regarding pt's low grade temp of 99 , sleeping arrangements in room for he and pt's sister, as well as a yaunker suction for pt. Father reassured primary care nurse, Shanda Bumps, notifying MD regarding temp; in the meantime, pt encouraged to use IS with demonstrated understanding. Instructed to use IS every 1/2-1 hour with verbalized understanding. Reassured pt and family that any linen needed for tonight will be provided for sister and father to rest in window seat/bed and large recliner. Explained to father wall unit will only accommodate suction for pt's NGT. Provided sponges with cold, ice water and biotene for dry mouth. Offered to have nightshift AC come by for any further concerns or questions. Pt's father stated "That would be great."

## 2012-01-31 NOTE — Progress Notes (Signed)
2 Days Post-Op  Subjective: Using less pain meds today. No nausea. No flatus. Sat in chair yesterday. 400cc bile from ng  Objective: Vital signs in last 24 hours: Temp:  [97.4 F (36.3 C)-98.5 F (36.9 C)] 97.4 F (36.3 C) (03/01 0800) Pulse Rate:  [46-78] 50  (03/01 0700) Resp:  [9-22] 21  (03/01 0700) BP: (99-125)/(52-93) 110/66 mmHg (03/01 0700) SpO2:  [98 %-100 %] 100 % (03/01 0700) Weight:  [217 lb 6 oz (98.6 kg)] 217 lb 6 oz (98.6 kg) (03/01 0600)    Intake/Output from previous day: 02/28 0701 - 03/01 0700 In: 2447 [I.V.:2367; NG/GT:30; IV Piggyback:50] Out: 2560 [Urine:1810; Emesis/NG output:400; Drains:350] Intake/Output this shift:    Alert, nad cta Reg (brady) Soft, some distension, expected TTP. No bowel sounds. Drain-serosang. Wound vac intact.  Lab Results:   Basename 01/31/12 0310 01/30/12 0305  WBC 24.2* 19.3*  HGB 8.3* 9.0*  HCT 26.5* 27.0*  PLT 389 365   BMET  Basename 01/31/12 0310 01/30/12 0305  NA 141 136  K 4.3 4.3  CL 108 102  CO2 29 28  GLUCOSE 153* 144*  BUN 11 8  CREATININE 0.70 0.60  CALCIUM 9.1 8.4   PT/INR No results found for this basename: LABPROT:2,INR:2 in the last 72 hours ABG No results found for this basename: PHART:2,PCO2:2,PO2:2,HCO3:2 in the last 72 hours  Studies/Results: Ct Abdomen Pelvis W Contrast  01/29/2012  *RADIOLOGY REPORT*  Clinical Data: Abdominal pain over the past 6 days with nausea and vomiting.  Fever.  CT ABDOMEN AND PELVIS WITH CONTRAST  Technique:  Multidetector CT imaging of the abdomen and pelvis was performed following the standard protocol during bolus administration of intravenous contrast.  Contrast:  100 ml Omnipaque-300  Comparison: 03/01/2010  Findings: Small bilateral pleural effusions are present associated with bibasilar passive atelectasis.  A small amount of perihepatic ascites is present.  The liver appears otherwise normal.  The spleen, pancreas, and adrenal glands appear normal.  The  kidneys appear unremarkable, as do the proximal ureters.  Contrast medium noted in the small bowel.  This extends to the cecum.  There is extensive inflammatory phlegmon in the right lower quadrant, along with marked wall thickening in the cecum and adjacent prominent pericecal lymph nodes.  In addition to pelvic ascites, there is a complex cystic lesion above the urinary bladder measuring 12.7 ml by 9.9 by 10.8 cm. This lesion contains a more dense 4.8 x 6.2 x 6.4 cm component.  I am uncertain whether these represent abscess or ovarian lesions; these lesions are difficult to separate from the left ovary, and the right ovary is difficult to identify separate from the right lower quadrant phlegmon.  Right lower quadrant fluid along the margin of this cyst may also be demonstrating some early loculation.  The uterine contour appears unremarkable.  IMPRESSION:  1.  Severe inflammatory process in the right lower quadrant, favoring a ruptured acute appendicitis with severe inflammation of the adjacent cecum and terminal ileum. The appendix is completely obscured.  Active Crohn's disease with abscess formation obscuring the appendix is a differential diagnostic consideration.  There is ascites in the upper and lower abdomen along with small bilateral pleural effusions. Surgical assessment is recommended. 2.  New irregular complex large cystic lesions above the urinary bladder.  Although these could possibly be ovarian in origin, I am suspicious for pelvic abscesses related to ruptured appendicitis. Endometriomas or complex ovarian cysts are a less likely differential diagnostic consideration.  Original Report Authenticated By:  Dellia Cloud, M.D.    Anti-infectives: Anti-infectives     Start     Dose/Rate Route Frequency Ordered Stop   01/30/12 2000   ertapenem (INVANZ) 1 g in sodium chloride 0.9 % 50 mL IVPB        1 g 100 mL/hr over 30 Minutes Intravenous Every 24 hours 01/30/12 0155     01/30/12 0154    ertapenem (INVANZ) 1 g in sodium chloride 0.9 % 50 mL IVPB  Status:  Discontinued        1 g 100 mL/hr over 30 Minutes Intravenous Every 24 hours 01/30/12 0154 01/30/12 0207   01/29/12 2045   ertapenem (INVANZ) 1 g in sodium chloride 0.9 % 50 mL IVPB  Status:  Discontinued        1 g 100 mL/hr over 30 Minutes Intravenous On call to O.R. 01/29/12 2033 01/30/12 0209   01/29/12 1530   piperacillin-tazobactam (ZOSYN) IVPB 3.375 g        3.375 g 12.5 mL/hr over 240 Minutes Intravenous  Once 01/29/12 1526 01/29/12 1931          Assessment/Plan: s/p Procedure(s) (LRB): LAPAROSCOPY DIAGNOSTIC (N/A) EXPLORATORY LAPAROTOMY (N/A) UNILATERAL SALPINGECTOMY (Left) APPLICATION OF WOUND VAC () S/p ILEOCECECTOMY  Transfer to floor D/c foley Cont bowel rest with ng tube Cont iv abx for perforated appx Cont lovenox Watch Hgb, repeat cbc in am, if hgb cont to drift down-will hold lovenox Wound care consult for wound vac Wean O2, cont pulm toilet  Mary Sella. Andrey Campanile, MD, FACS General, Bariatric, & Minimally Invasive Surgery [facility], [place]    LOS: 2 days    Atilano Ina 01/31/2012

## 2012-01-31 NOTE — Progress Notes (Signed)
Pt's father arrived to pt's room after transfer from ICU. RN and charge RN went into pt's room and were explaining the pt's active/scheduled orders and the pt care equipment ( e.g., NG tube, PCA pump, Continuous pulse ox, Wound VAC, JP drain, etc) and their function/benefit to the patient. Pt's father was increasingly agitated, verbally aggressive to RN; pt's father demanded that RN call MD to obtain orders for medications that the father thought the patient should have.  At 18:00,pt's temperature was 99.6 degrees F; RN educated pt on the benefits of using IS and its effectiveness in treating a low-grade temperature. Pt's father demanded that RN call MD and obtain an order to PO Tylenol. RN communicated to father that per our hospital's protocol/procedures, nothing would be ordered (ex: PO Tylenol) until pt's fever was at or above 100.5 degrees F. RN assures pt and pt's father that temperature would continue to be monitored. RN notified MD, who advised RN to reassure and again communicate to pt's father protocol for treatment regarding elevated temperatures.   RN , charge RN alerted night AC of the situation , as well as all efforts aimed at diffusing situation. Marcelino Duster, RN

## 2012-01-31 NOTE — Progress Notes (Signed)
[name] [name] 1225 TRANSFERRED TO 1537- REPORT GIVEN TO JESSICA RN. [name]- HAS PCA MS FOR PAIN. NO CHANGE IN HER CONDITION.

## 2012-02-01 NOTE — Progress Notes (Signed)
Patient ID: [name] [name], female   DOB: November 11, 1986, 26 y.o.   MRN: [id number] 3 Days Post-Op  Subjective: Denies severe pain, controlled with meds. Has passed flatus. No bowel movement.  Objective: Vital signs in last 24 hours: Temp:  [98.5 F (36.9 C)-99.8 F (37.7 C)] 99.5 F (37.5 C) (03/02 0600) Pulse Rate:  [47-83] 78  (03/02 0600) Resp:  [18-23] 19  (03/02 0600) BP: (105-128)/(58-74) 128/74 mmHg (03/02 0600) SpO2:  [98 %-100 %] 100 % (03/02 0600)    Intake/Output from previous day: 03/01 0701 - 03/02 0700 In: 2148 [I.V.:2118; NG/GT:30] Out: 1630 [Urine:1170; Emesis/NG output:350; Drains:110] Intake/Output this shift:  NG 100 cc last shift  General appearance: alert and no distress GI: normal findings: soft, non-tender Incision/Wound:     VAC in place. JP is serous.  Lab Results:   Basename 01/31/12 0310 01/30/12 0305  WBC 24.2* 19.3*  HGB 8.3* 9.0*  HCT 26.5* 27.0*  PLT 389 365   BMET  Basename 01/31/12 0310 01/30/12 0305  NA 141 136  K 4.3 4.3  CL 108 102  CO2 29 28  GLUCOSE 153* 144*  BUN 11 8  CREATININE 0.70 0.60  CALCIUM 9.1 8.4     Studies/Results: No results found.  Anti-infectives: Anti-infectives     Start     Dose/Rate Route Frequency Ordered Stop   01/30/12 2000   ertapenem (INVANZ) 1 g in sodium chloride 0.9 % 50 mL IVPB        1 g 100 mL/hr over 30 Minutes Intravenous Every 24 hours 01/30/12 0155     01/30/12 0154   ertapenem (INVANZ) 1 g in sodium chloride 0.9 % 50 mL IVPB  Status:  Discontinued        1 g 100 mL/hr over 30 Minutes Intravenous Every 24 hours 01/30/12 0154 01/30/12 0207   01/29/12 2045   ertapenem (INVANZ) 1 g in sodium chloride 0.9 % 50 mL IVPB  Status:  Discontinued        1 g 100 mL/hr over 30 Minutes Intravenous On call to O.R. 01/29/12 2033 01/30/12 0209   01/29/12 1530  piperacillin-tazobactam (ZOSYN) IVPB 3.375 g       3.375 g 12.5 mL/hr over 240 Minutes Intravenous  Once 01/29/12 1526 01/29/12 1931          Assessment/Plan: s/p Procedure(s): LAPAROSCOPY DIAGNOSTIC EXPLORATORY LAPAROTOMY and ilio cecectomy UNILATERAL SALPINGECTOMY APPLICATION OF WOUND VAC Generally seems to be doing well although elevated white count is a little worrisome. Her abdomen seems benign on exam. She has flatus and minimal NG output. Will DC NG and keep n.p.o. Except for ice. Continue IV antibiotics. Recheck CBC tomorrow.   LOS: 3 days    Dann Ventress T 02/01/2012

## 2012-02-02 LAB — CBC
HCT: 25.4 % — ABNORMAL LOW (ref 36.0–46.0)
Hemoglobin: 7.9 g/dL — ABNORMAL LOW (ref 12.0–15.0)
MCH: 21.5 pg — ABNORMAL LOW (ref 26.0–34.0)
MCHC: 31.1 g/dL (ref 30.0–36.0)
MCV: 69 fL — ABNORMAL LOW (ref 78.0–100.0)
Platelets: 357 10*3/uL (ref 150–400)
RBC: 3.68 MIL/uL — ABNORMAL LOW (ref 3.87–5.11)
RDW: 15.1 % (ref 11.5–15.5)
WBC: 16.1 10*3/uL — ABNORMAL HIGH (ref 4.0–10.5)

## 2012-02-02 LAB — CULTURE, ROUTINE-ABSCESS

## 2012-02-02 LAB — BASIC METABOLIC PANEL
Chloride: 103 mEq/L (ref 96–112)
Creatinine, Ser: 0.79 mg/dL (ref 0.50–1.10)
GFR calc Af Amer: >90 mL/min (ref >90)
Potassium: 3.6 mEq/L (ref 3.5–5.1)
Sodium: 135 mEq/L (ref 135–145)

## 2012-02-02 NOTE — Progress Notes (Signed)
Patient ID: [name] [name], female   DOB: June 03, 1986, 26 y.o.   MRN: [id number] 4 Days Post-Op  Subjective: No complaints. NG has been clamped for 24 hours without nausea. No flatus or bowel movement. Only mild incisional pain controlled with medications. She has been up out of bed.  Objective: Vital signs in last 24 hours: Temp:  [99 F (37.2 C)-101.8 F (38.8 C)] 99 F (37.2 C) (03/03 0500) Pulse Rate:  [70-87] 75  (03/03 0500) Resp:  [16-28] 18  (03/03 0500) BP: (104-116)/(54-64) 113/59 mmHg (03/03 0500) SpO2:  [99 %-100 %] 100 % (03/03 0500)    Intake/Output from previous day: 03/02 0701 - 03/03 0700 In: 2323.3 [I.V.:2323.3] Out: 1635 [Urine:1550; Drains:85] Intake/Output this shift:    General appearance: alert and no distress GI: normal findings: soft, non-tender, no bowel sounds Incision/Wound: wound VAC in place. JP drainage is serous.  Lab Results:   Basename 02/02/12 0422 01/31/12 0310  WBC 16.1* 24.2*  HGB 7.9* 8.3*  HCT 25.4* 26.5*  PLT 357 389   BMET  Basename 02/02/12 0422 01/31/12 0310  NA 135 141  K 3.6 4.3  CL 103 108  CO2 27 29  GLUCOSE 122* 153*  BUN 5* 11  CREATININE 0.79 0.70  CALCIUM 8.4 9.1     Studies/Results: No results found.  Anti-infectives: Anti-infectives     Start     Dose/Rate Route Frequency Ordered Stop   01/30/12 2000   ertapenem (INVANZ) 1 g in sodium chloride 0.9 % 50 mL IVPB        1 g 100 mL/hr over 30 Minutes Intravenous Every 24 hours 01/30/12 0155     01/30/12 0154   ertapenem (INVANZ) 1 g in sodium chloride 0.9 % 50 mL IVPB  Status:  Discontinued        1 g 100 mL/hr over 30 Minutes Intravenous Every 24 hours 01/30/12 0154 01/30/12 0207   01/29/12 2045   ertapenem (INVANZ) 1 g in sodium chloride 0.9 % 50 mL IVPB  Status:  Discontinued        1 g 100 mL/hr over 30 Minutes Intravenous On call to O.R. 01/29/12 2033 01/30/12 0209   01/29/12 1530   piperacillin-tazobactam (ZOSYN) IVPB 3.375 g        3.375  g 12.5 mL/hr over 240 Minutes Intravenous  Once 01/29/12 1526 01/29/12 1931          Assessment/Plan: s/p Procedure(s): LAPAROSCOPY DIAGNOSTIC EXPLORATORY LAPAROTOMY,ileocecectomy UNILATERAL SALPINGECTOMY APPLICATION OF WOUND VAC  Stable postop. Still with some fever and elevated white count but trending down. Abdomen appears benign. Continue IV antibiotics. DC NG tube. N.p.o. Except ice chips.   LOS: 4 days    [name] [name] 02/02/2012

## 2012-02-03 DIAGNOSIS — K567 Ileus, unspecified: Secondary | ICD-10-CM | POA: Diagnosis not present

## 2012-02-03 LAB — CBC
HCT: 25.5 % — ABNORMAL LOW (ref 36.0–46.0)
Platelets: 340 10*3/uL (ref 150–400)
RBC: 3.73 MIL/uL — ABNORMAL LOW (ref 3.87–5.11)
RDW: 15.3 % (ref 11.5–15.5)
WBC: 14.8 10*3/uL — ABNORMAL HIGH (ref 4.0–10.5)

## 2012-02-03 NOTE — Progress Notes (Signed)
-  Cont IV ABx -Adv diet gradually as ileus resolves. -CT scan if fails to improve or worsens - no strong need for that now

## 2012-02-03 NOTE — Consult Note (Signed)
WOC consult Note Reason for Consult:Mid-abdominal wound with NPWT Wound type:Surgical Measurement:  9.5 x 6 x 3.5cm Wound bed: moist, pink, with visible suture in wound bed Drainage (amount, consistency, odor) serosanguinous in cannister Periwound:intact Dressing procedure/placement/frequency:NPWT dressing changes every M-W-F PA Will Jennings in to view wound during dressing change.  Father, Gearldine Shown and sister present for dressing change.  Teaching regarding NPWT provided, frequencies and expectations explained. I will follow with you. Thanks, Ladona Mow, MSN, RN, [facility], CWOCN [phone])

## 2012-02-03 NOTE — Progress Notes (Signed)
5 Days Post-Op  Subjective: Low grade fevers VSS  I/O=2400/1000  100 ml thru drain, WBC better but still up, on Invanz.  Still feels puny,  Has had a little flatus but not much.  Walked some, but again not much.  No nausea with NG out,  On Ice chips.  Objective: Vital signs in last 24 hours: Temp:  [98.8 F (37.1 C)-100.1 F (37.8 C)] 99.6 F (37.6 C) (03/04 0650) Pulse Rate:  [74-79] 79  (03/04 0650) Resp:  [20-28] 22  (03/04 0650) BP: (100-118)/(47-72) 100/64 mmHg (03/04 0650) SpO2:  [98 %-100 %] 100 % (03/04 0650)    Intake/Output from previous day: 03/03 0701 - 03/04 0700 In: 2400 [I.V.:2400] Out: 1000 [Urine:900; Drains:100] Intake/Output this shift:    PE:  WNWD, NAD  Chest:  Clear ABD:  Tender, few bowel sound, not distended.  Wound vac in place stapes in place Drain is clear.  Lab Results:   Basename 02/03/12 0340 02/02/12 0422  WBC 14.8* 16.1*  HGB 8.0* 7.9*  HCT 25.5* 25.4*  PLT 340 357    Lab 01/29/12 1505  AST 17  ALT 11  ALKPHOS 58  BILITOT 0.8  PROT 7.8  ALBUMIN 2.8*    BMET  Basename 02/02/12 0422  NA 135  K 3.6  CL 103  CO2 27  GLUCOSE 122*  BUN 5*  CREATININE 0.79  CALCIUM 8.4   PT/INR No results found for this basename: LABPROT:2,INR:2 in the last 72 hours   Studies/Results: No results found.  Anti-infectives: Anti-infectives     Start     Dose/Rate Route Frequency Ordered Stop   01/30/12 2000   ertapenem (INVANZ) 1 g in sodium chloride 0.9 % 50 mL IVPB        1 g 100 mL/hr over 30 Minutes Intravenous Every 24 hours 01/30/12 0155     01/30/12 0154   ertapenem (INVANZ) 1 g in sodium chloride 0.9 % 50 mL IVPB  Status:  Discontinued        1 g 100 mL/hr over 30 Minutes Intravenous Every 24 hours 01/30/12 0154 01/30/12 0207   01/29/12 2045   ertapenem (INVANZ) 1 g in sodium chloride 0.9 % 50 mL IVPB  Status:  Discontinued        1 g 100 mL/hr over 30 Minutes Intravenous On call to O.R. 01/29/12 2033 01/30/12 0209   01/29/12  1530  piperacillin-tazobactam (ZOSYN) IVPB 3.375 g       3.375 g 12.5 mL/hr over 240 Minutes Intravenous  Once 01/29/12 1526 01/29/12 1931         Current Facility-Administered Medications  Medication Dose Route Frequency Provider Last Rate Last Dose  . dextrose 5 % and 0.9 % NaCl with KCl 20 mEq/L infusion   Intravenous Continuous Caleen Essex III, MD 100 mL/hr at 02/03/12 0409    . diphenhydrAMINE (BENADRYL) injection 12.5 mg  12.5 mg Intravenous Q6H PRN Caleen Essex III, MD       Or  . diphenhydrAMINE (BENADRYL) 12.5 MG/5ML elixir 12.5 mg  12.5 mg Oral Q6H PRN Caleen Essex III, MD      . enoxaparin (LOVENOX) injection 40 mg  40 mg Subcutaneous Q24H Atilano Ina, MD,FACS   40 mg at 02/02/12 2041  . ertapenem (INVANZ) 1 g in sodium chloride 0.9 % 50 mL IVPB  1 g Intravenous Q24H Caleen Essex III, MD   1 g at 02/02/12 2041  . morphine 1 MG/ML PCA injection  Intravenous Q4H Caleen Essex III, MD   1.5 mg at 02/03/12 0400  . morphine 4 MG/ML injection 4 mg  4 mg Intravenous Q1H PRN Caleen Essex III, MD      . naloxone [facility]) injection 0.4 mg  0.4 mg Intravenous PRN Caleen Essex III, MD       And  . sodium chloride 0.9 % injection 9 mL  9 mL Intravenous PRN Caleen Essex III, MD      . ondansetron [facility]) injection 4 mg  4 mg Intravenous Q6H PRN Caleen Essex III, MD      . ondansetron [facility]) tablet 4 mg  4 mg Oral Q6H PRN Caleen Essex III, MD       Or  . ondansetron [facility]) injection 4 mg  4 mg Intravenous Q6H PRN Caleen Essex III, MD      . pantoprazole (PROTONIX) injection 40 mg  40 mg Intravenous QHS Caleen Essex III, MD   40 mg at 02/02/12 2242  . promethazine (PHENERGAN) injection 12.5 mg  12.5 mg Intravenous Once Nelia Shi, MD        Assessment/Plan POD5 perforated appendix with terminal ileum and left fallopian tube involvement.  s/pLAPAROSCOPY DIAGNOSTIC (N/A)  EXPLORATORY LAPAROTOMY (N/A)  Ileocecectomy UNILATERAL SALPINGECTOMY (Left)  APPLICATION OF WOUND VAC ()  Dr. Jamey Ripa,  Dr Wilmer Floor Jackson/Moore 01/29/12 Post op ileus   Plan:  Mobilize more, continue antibiotics,  If she has more gas, we may be able to up her to clears later.  LOS: 5 days    [name] [name] 02/03/2012

## 2012-02-04 LAB — CULTURE, BLOOD (ROUTINE X 2)

## 2012-02-04 MED ORDER — MORPHINE SULFATE 4 MG/ML IJ SOLN
4.0000 mg | INTRAMUSCULAR | Status: DC | PRN
  Administered 2012-02-05: 1 mg via INTRAVENOUS
  Administered 2012-02-06: 4 mg via INTRAVENOUS
  Filled 2012-02-04 (×2): qty 1

## 2012-02-04 MED ORDER — OXYCODONE HCL 5 MG PO TABS
5.0000 mg | ORAL_TABLET | ORAL | Status: DC | PRN
  Administered 2012-02-04 – 2012-02-05 (×3): 5 mg via ORAL
  Administered 2012-02-05 (×2): 10 mg via ORAL
  Administered 2012-02-05 (×3): 5 mg via ORAL
  Administered 2012-02-06: 10 mg via ORAL
  Administered 2012-02-06 (×3): 5 mg via ORAL
  Administered 2012-02-06: 10 mg via ORAL
  Administered 2012-02-07: 15 mg via ORAL
  Administered 2012-02-07: 5 mg via ORAL
  Administered 2012-02-07: 15 mg via ORAL
  Administered 2012-02-07 (×2): 10 mg via ORAL
  Administered 2012-02-08 (×3): 15 mg via ORAL
  Administered 2012-02-08 – 2012-02-18 (×25): 10 mg via ORAL
  Administered 2012-02-18 (×2): 5 mg via ORAL
  Filled 2012-02-04: qty 2
  Filled 2012-02-04: qty 1
  Filled 2012-02-04 (×4): qty 2
  Filled 2012-02-04: qty 1
  Filled 2012-02-04: qty 3
  Filled 2012-02-04: qty 1
  Filled 2012-02-04: qty 2
  Filled 2012-02-04: qty 3
  Filled 2012-02-04 (×3): qty 2
  Filled 2012-02-04 (×5): qty 1
  Filled 2012-02-04 (×4): qty 2
  Filled 2012-02-04 (×2): qty 1
  Filled 2012-02-04: qty 2
  Filled 2012-02-04 (×3): qty 3
  Filled 2012-02-04: qty 1
  Filled 2012-02-04 (×2): qty 2
  Filled 2012-02-04: qty 1
  Filled 2012-02-04 (×3): qty 2
  Filled 2012-02-04: qty 1
  Filled 2012-02-04: qty 2
  Filled 2012-02-04 (×2): qty 1
  Filled 2012-02-04: qty 3
  Filled 2012-02-04 (×3): qty 2
  Filled 2012-02-04: qty 1
  Filled 2012-02-04: qty 3
  Filled 2012-02-04 (×5): qty 2
  Filled 2012-02-04: qty 1

## 2012-02-04 MED ORDER — ALUM & MAG HYDROXIDE-SIMETH 200-200-20 MG/5ML PO SUSP: 30.0000 mL | Freq: Four times a day (QID) | ORAL | Status: DC | PRN

## 2012-02-04 MED ORDER — FLORA-Q PO CAPS
1.0000 | ORAL_CAPSULE | Freq: Every day | ORAL | Status: DC
  Administered 2012-02-04 – 2012-02-19 (×16): 1 via ORAL
  Filled 2012-02-04 (×16): qty 1

## 2012-02-04 MED ORDER — MAGIC MOUTHWASH
15.0000 mL | Freq: Four times a day (QID) | ORAL | Status: DC | PRN
  Filled 2012-02-04: qty 15

## 2012-02-04 MED ORDER — PSYLLIUM 95 % PO PACK
1.0000 | PACK | Freq: Two times a day (BID) | ORAL | Status: DC
  Administered 2012-02-04 – 2012-02-16 (×8): 1 via ORAL
  Filled 2012-02-04 (×31): qty 1

## 2012-02-04 NOTE — Progress Notes (Signed)
Adv diet gradually - do not force Continue Abx x 7 days total

## 2012-02-04 NOTE — Progress Notes (Signed)
6 Days Post-Op   Subjective: Feeling good. Pain controlled, no N/V. +flatus, +BM.  Objective: Vital signs in last 24 hours: Temp:  [98.5 F (36.9 C)-99.8 F (37.7 C)] 99 F (37.2 C) (03/05 0628) Pulse Rate:  [68-76] 76  (03/05 0628) Resp:  [18-30] 24  (03/05 0800) BP: (103-119)/(66-73) 103/66 mmHg (03/05 0628) SpO2:  [97 %-100 %] 100 % (03/05 0800) FiO2 (%):  [100 %] 100 % (03/04 2324) Last BM Date: 02/04/12  Intake/Output from previous day: Drain: 32ml/24h    General appearance: alert and no distress Resp: clear to auscultation bilaterally Cardio: regular rate and rhythm GI: Soft, VAC in place, incision C/D/I. +BS.   Assessment/Plan: s/p Procedure(s) (LRB): LAPAROSCOPY DIAGNOSTIC (N/A) EXPLORATORY LAPAROTOMY (N/A) UNILATERAL SALPINGECTOMY (Left) APPLICATION OF WOUND VAC ()  Will give clears, d/c PCA. Drain can likely come out but will defer that to MD.  LOS: 6 days    JEFFERY,MICHAEL J. 02/04/2012

## 2012-02-04 NOTE — Discharge Summary (Signed)
Physician Discharge Summary  Patient ID: [name] [name] MRN: [id number] DOB/AGE: 26/28/1987 25 y.o.  Admit date: 01/29/2012 Discharge date: 02/19/2012  Admission Diagnoses:Perforated appendicitis with complex cystic lesion in pelvis    Discharge Diagnoses:Perforated appendix with terminal ileum and left fallopian tube involvement  Active Problems:  Ileus following gastrointestinal surgery  Anemia  Renal insufficiency  Pleural effusion  Vaginal bleeding   PROCEDURES:  LAPAROSCOPY DIAGNOSTIC (N/A)  EXPLORATORY LAPAROTOMY (N/A)  UNILATERAL SALPINGECTOMY (Left)  APPLICATION OF WOUND VAC    01/29/2012 [name], MD - Assisting  * Robyne Askew, MD - Primary  * Roseanna Rainbow, MD - Assisting  Consults: DR. Wilmer Floor [facility] Course: 25yo bf started with flu like symptoms last wed. She took tamiflu today and subsequently vomited. At this point she started having abd pain. She describes it as all over. She underwent CT today which was suggestive of perforated appendicitis with a complex cystic area in pelvis. Not clear whether this is abscess or gyn in nature.  She underwent the above noted procedure, she tolerated it well.  She did have some post op ileus, and was slow to mobilize.  She had a wound vac placed and has had good improvement from that.  As her ileus improved her diet and mobility advanced. She developed fever and has had repeated CT's which showed fluid collections in the pelvis, none were amenable to drainage by IR. Antibiotics were adjusted. She also had some consolidation in her lungs, with concern for pneumonia.  Antibiotics included Vancomycin and after some time she developed an elevated creatinine. Highest creatinine 1.66 02/14/12.  Vancomycin and NSAID were discontinued with the first creatinine elevation.  She has had a repeat CT scan, which show improvement in fluid collections,  She is afebrile and WBC has normalized.  She also had some Vaginal  bleeding which was treated with Provera, by Dr. Tamela Oddi. Her open wound has shown great improvement and we will send her home with a woundVac which has worked very well for her. She has had post-op anemia, and iron has been hard on her stomach, she remains stable at this point, but not much improvement, especially with daily blood draws.   We plan D/C home today, off antibiotics, and follow up in 1 week with DR. Carolynne Edouard, Surgery, Dr. Piedad Climes PCP. 2 weeks follow up with Dr. Tamela Oddi.  Wound Vac to be applied by home health nurse.  Condition on D/C:  Improving.  CBC    Component Value Date/Time   WBC 9.2 02/19/2012 0440   RBC 3.12* 02/19/2012 0440   HGB 6.7* 02/19/2012 0440   HCT 21.5* 02/19/2012 0440   PLT 538* 02/19/2012 0440   MCV 68.9* 02/19/2012 0440   MCH 21.5* 02/19/2012 0440   MCHC 31.2 02/19/2012 0440   RDW 17.5* 02/19/2012 0440   LYMPHSABS 1.2 02/07/2012 1134   MONOABS 1.4* 02/07/2012 1134   EOSABS 0.2 02/07/2012 1134   BASOSABS 0.0 02/07/2012 1134   CMP     Component Value Date/Time   NA 138 02/19/2012 0440   K 3.9 02/19/2012 0440   CL 104 02/19/2012 0440   CO2 24 02/19/2012 0440   GLUCOSE 88 02/19/2012 0440   BUN 6 02/19/2012 0440   CREATININE 1.43* 02/19/2012 0440   CALCIUM 9.1 02/19/2012 0440   PROT 6.4 02/07/2012 1134   ALBUMIN 2.0* 02/07/2012 1134   AST 18 02/07/2012 1134   ALT 9 02/07/2012 1134   ALKPHOS 60 02/07/2012 1134  BILITOT 0.5 02/07/2012 1134   GFRNONAA 50* 02/19/2012 0440   GFRAA 58* 02/19/2012 0440        Disposition: 01-Home or Self Care   Medication List  As of 02/19/2012  1:59 PM   STOP taking these medications         oseltamivir 75 MG capsule         TAKE these medications         acetaminophen 325 MG tablet   Commonly known as: TYLENOL   Take 1-2 tablets (325-650 mg total) by mouth every 6 (six) hours as needed.      ferrous fumarate 325 (106 FE) MG Tabs   Commonly known as: HEMOCYTE - 106 mg FE   Take 1 tablet (106 mg of iron total) by mouth  at bedtime.      Flora-Q Caps   Take 1 capsule by mouth daily.      multivitamins with iron Tabs   Take 1 tablet by mouth daily.      oxyCODONE-acetaminophen 5-325 MG per tablet   Commonly known as: PERCOCET   Take 1-2 tablets by mouth every 4 (four) hours as needed for pain.           Follow-up Information    Follow up with TOTH III,PAUL S, MD in 1 week.   Contact information:   [employer] [address] [place] [address] [phone]       Follow up with Roseanna Rainbow, MD. Schedule an appointment as soon as possible for a visit in 2 weeks.   Contact information:   [address], [address] [place] [address] [phone]       Follow up with Labs. (If Dr. Bonnye Fava cannot see you go to have labs drawn the day before you see DR. Carolynne Edouard.   Go to [address] lab to get labd drawn.)       Follow up with [place], [place]. Schedule an appointment as soon as possible for a visit in 1 week. (Have her do your labs and follow up if she can.  If not go to lab 1 day before you see DR. Carolynne Edouard)    Contact information:   [address] [place] [address]          Signed: Sherrie George 02/19/2012, 1:59 PM

## 2012-02-05 LAB — ANAEROBIC CULTURE

## 2012-02-05 LAB — CBC
HCT: 21.8 % — ABNORMAL LOW (ref 36.0–46.0)
Hemoglobin: 7 g/dL — ABNORMAL LOW (ref 12.0–15.0)
MCV: 67.7 fL — ABNORMAL LOW (ref 78.0–100.0)
RBC: 3.22 MIL/uL — ABNORMAL LOW (ref 3.87–5.11)
WBC: 11.6 10*3/uL — ABNORMAL HIGH (ref 4.0–10.5)

## 2012-02-05 MED ORDER — SODIUM CHLORIDE 0.9 % IJ SOLN
3.0000 mL | Freq: Two times a day (BID) | INTRAMUSCULAR | Status: DC
  Administered 2012-02-05 – 2012-02-14 (×8): 3 mL via INTRAVENOUS

## 2012-02-05 NOTE — Progress Notes (Signed)
Measurement of mid-abd wound 9.5x6x3.5cm. Wound vac in use.

## 2012-02-05 NOTE — Progress Notes (Signed)
Wound vac changed  And it looks very good.  I gave her the option of going with wound vac or wet to dry dressings.  She wants the solution that will get her well quickest.  She is just starting Full liquids, if she does well advance tomorrow to soft diet.  Apparently she lives alone.  Will make sure Home health and vac set up.  WBC still up some, will discuss starting Po antibiotics tomorrow and give a full 10 day course.

## 2012-02-05 NOTE — Progress Notes (Signed)
7 Days Post-Op   Subjective: No N/V. +flatus, no BM but feels like it's close. Pain controlled.  Objective: Vital signs in last 24 hours: Temp:  [99.3 F (37.4 C)-100.2 F (37.9 C)] 99.3 F (37.4 C) (03/06 0634) Pulse Rate:  [74-90] 74  (03/06 0634) Resp:  [16-20] 16  (03/06 0634) BP: (101-119)/(64-78) 119/78 mmHg (03/06 0634) SpO2:  [95 %-97 %] 95 % (03/06 0634) Last BM Date: 02/04/12  Intake/Output from previous day: 03/05 0701 - 03/06 0700 In: 120 [P.O.:120] Out: 1180 [Urine:1150; Drains:30]   General appearance: alert and no distress Resp: clear to auscultation bilaterally Cardio: regular rate and rhythm GI: VAC in place. +BS, soft. Incision C/D/I. Bulb with serosanguinous fluid.   Lab Results:   Basename 02/05/12 0443 02/03/12 0340  WBC 11.6* 14.8*  HGB 7.0* 8.0*  HCT 21.8* 25.5*  PLT 402* 340    Assessment/Plan: s/p Procedure(s) (LRB):  LAPAROSCOPY DIAGNOSTIC (N/A)  EXPLORATORY LAPAROTOMY (N/A)  UNILATERAL SALPINGECTOMY (Left)  APPLICATION OF WOUND VAC ()   Advance to fulls. Continue drain with increased output.  Hg down but plts up so suspect equilibration. Check another CBC in am. D/C Invanz after tonight's dose (7d total)    LOS: 7 days    Kimia Finan J. 02/05/2012

## 2012-02-05 NOTE — Progress Notes (Signed)
Patient seen and examined.  Agree with PA's note.  

## 2012-02-06 ENCOUNTER — Inpatient Hospital Stay (HOSPITAL_COMMUNITY): Payer: Managed Care, Other (non HMO)

## 2012-02-06 ENCOUNTER — Other Ambulatory Visit (HOSPITAL_COMMUNITY): Payer: Managed Care, Other (non HMO)

## 2012-02-06 LAB — TYPE AND SCREEN: Antibody Screen: NEGATIVE

## 2012-02-06 LAB — CBC
HCT: 21.5 % — ABNORMAL LOW (ref 36.0–46.0)
Hemoglobin: 6.9 g/dL — CL (ref 12.0–15.0)
MCH: 21.6 pg — ABNORMAL LOW (ref 26.0–34.0)
MCHC: 32.1 g/dL (ref 30.0–36.0)
MCV: 67.4 fL — ABNORMAL LOW (ref 78.0–100.0)

## 2012-02-06 MED ORDER — SODIUM CHLORIDE 0.9 % IV SOLN
1.0000 g | INTRAVENOUS | Status: DC
  Administered 2012-02-06: 1 g via INTRAVENOUS
  Filled 2012-02-06: qty 1

## 2012-02-06 MED ORDER — TAB-A-VITE/IRON PO TABS
1.0000 | ORAL_TABLET | Freq: Every day | ORAL | Status: DC
  Administered 2012-02-06 – 2012-02-19 (×14): 1 via ORAL
  Filled 2012-02-06 (×14): qty 1

## 2012-02-06 MED ORDER — IOHEXOL 300 MG/ML  SOLN
100.0000 mL | Freq: Once | INTRAMUSCULAR | Status: AC | PRN
  Administered 2012-02-06: 100 mL via INTRAVENOUS

## 2012-02-06 MED ORDER — MORPHINE SULFATE 4 MG/ML IJ SOLN: 4.0000 mg | INTRAMUSCULAR | Status: DC | PRN

## 2012-02-06 MED ORDER — DIPHENHYDRAMINE HCL 50 MG/ML IJ SOLN: 12.5000 mg | Freq: Four times a day (QID) | INTRAMUSCULAR | Status: DC | PRN

## 2012-02-06 MED ORDER — BISACODYL 10 MG RE SUPP
10.0000 mg | Freq: Two times a day (BID) | RECTAL | Status: DC | PRN
  Filled 2012-02-06 (×3): qty 1

## 2012-02-06 MED ORDER — FLUCONAZOLE 200 MG PO TABS
400.0000 mg | ORAL_TABLET | Freq: Every day | ORAL | Status: DC
  Administered 2012-02-06 – 2012-02-19 (×14): 400 mg via ORAL
  Filled 2012-02-06 (×14): qty 2

## 2012-02-06 MED ORDER — PROMETHAZINE HCL 25 MG/ML IJ SOLN
12.5000 mg | Freq: Four times a day (QID) | INTRAMUSCULAR | Status: DC | PRN
  Administered 2012-02-06 – 2012-02-10 (×2): 25 mg via INTRAVENOUS
  Filled 2012-02-06 (×2): qty 1

## 2012-02-06 MED ORDER — PIPERACILLIN-TAZOBACTAM 3.375 G IVPB
3.3750 g | Freq: Three times a day (TID) | INTRAVENOUS | Status: DC
  Administered 2012-02-06 – 2012-02-19 (×38): 3.375 g via INTRAVENOUS
  Filled 2012-02-06 (×43): qty 50

## 2012-02-06 MED ORDER — ACETAMINOPHEN 325 MG PO TABS
325.0000 mg | ORAL_TABLET | Freq: Four times a day (QID) | ORAL | Status: DC | PRN
  Administered 2012-02-07: 650 mg via ORAL
  Filled 2012-02-06: qty 2

## 2012-02-06 MED ORDER — NAPROXEN 500 MG PO TABS
500.0000 mg | ORAL_TABLET | Freq: Two times a day (BID) | ORAL | Status: DC
  Administered 2012-02-06 – 2012-02-13 (×11): 500 mg via ORAL
  Filled 2012-02-06 (×17): qty 1

## 2012-02-06 NOTE — Progress Notes (Signed)
HOME HEALTH AGENCIES SERVING [place]   Agencies that are Medicare-Certified and are affiliated with The [facility] Health [facility]  Telephone Number Address  [facility]   The [facility] has ownership interest in this company; however, you are under no obligation to use this agency. [phone] or  [phone] [address] [place], [place] [address]   Agencies that are Medicare-Certified and are not affiliated with [facility]                                                                                 Home Health Agency Telephone Number Address  [facility] [phone] Fax [phone] [address] [place], [place]  [address]  [facility] [phone] or [phone] Fax [phone] [address] [place], [place] [address]  [facility] [phone] Fax [phone] [address] [place], [place] [address]  [facility] [phone] Fax [phone] [address] [place], [place]  [address]  [facility] [phone] Fax [phone] [address] [place], [place] [address]  [facility] [phone] [address] [place], [place] [address]  [facility] [phone]  [address] [place], [place] [address]  [facility] [phone] or [phone] Fax [phone] [address] [place], [place]  [address]  [facility] [phone] Fax [phone] [address] [place], [place]  [address]  [facility]  [phone] Fax [phone] [address] [place], [place] [address]               Agencies that are not Medicare-Certified and are not affiliated with The  Health System   Home Health Agency Telephone Number Address  [facility] [phone] or [phone] Fax [phone] [address] [place], [place]  [address]  [facility] [phone] Fax [phone] [address] [place], [place]  [address]  [facility]  [phone] Fax [phone] [address] [place], [place] [address]  [facility] [phone] Fax [phone] [address] , [place] [address]  [facility] [phone] or [phone] Fax [phone] [address] [place], [place]  [address]  [facility] [phone] or [phone] Fax [phone] [address] [place], [place]  [address]  [facility] [phone] Fax [phone] [address] [place], [place]  [address]  [facility] [phone] [address] [place], [place]  [address]  [facility] [phone] Fax [phone] [address] [place], [place]  [address]  [facility] [phone] Fax [phone] [address] [place], [place]  [address]  [facility] [phone] Fax [phone] [address] [place], [place] [address]  [facility] [phone] Fax [phone] [address] [place],   [address]   

## 2012-02-06 NOTE — Progress Notes (Signed)
8 Days Post-Op   Subjective: Had some increased pain this am, had to get a dose of morphine for breakthrough. Feeling fine now. No N/V. +flatus, no BM.  Objective: Vital signs in last 24 hours: Temp:  [98.6 F (37 C)-100.8 F (38.2 C)] 98.6 F (37 C) (03/07 0601) Pulse Rate:  [74-88] 88  (03/07 0601) Resp:  [16-20] 20  (03/07 0601) BP: (109-113)/(60-68) 111/65 mmHg (03/07 0601) SpO2:  [94 %-97 %] 94 % (03/07 0601) Last BM Date: 02/04/12  Intake/Output from previous day: 03/06 0701 - 03/07 0700 In: -  Out: 1460 [Urine:1450; Drains:10]   General appearance: alert and no distress Resp: clear to auscultation bilaterally Cardio: regular rate and rhythm GI: Soft, +BS. VAC in place, incision C/D/I.  Lab Results:   Basename 02/06/12 0430 02/05/12 0443  WBC 14.0* 11.6*  HGB 6.9* 7.0*  HCT 21.5* 21.8*  PLT 443* 402*   Assessment/Plan: s/p Procedure(s) (LRB):  LAPAROSCOPY DIAGNOSTIC (N/A)  EXPLORATORY LAPAROTOMY (N/A)  UNILATERAL SALPINGECTOMY (Left)  APPLICATION OF WOUND VAC ()   Advance to regular. With WBC elevation and increased pain this am will check CT. D/C drain if CT ok.  Hg essentially unchanged, no indication for transfusion at this point. WBC up today so Invanz continued. Repeat in am.     LOS: 8 days    [name] [name] J. 02/06/2012

## 2012-02-06 NOTE — Progress Notes (Signed)
-  Check CT to r/o abscess w inc WBC & low-grade fever off ABx -Mobilize & adv diet as tolerated -Add iron PO for anemia.  Hold off on transfusion since young & o/w healthy & no evid of cardiac strain.  Healing ok -Stop anticoag with low Hgb.  Cont SCDs

## 2012-02-06 NOTE — Progress Notes (Signed)
CARE MANAGEMENT NOTE 02/06/2012  Patient:  [name],[name]   Account Number:  [id number]  Date Initiated:  02/03/2012  Documentation initiated by:  Scout Gumbs  Subjective/Objective Assessment:   26 yo female admitted 01/29/12 with abdominal pain.     Action/Plan:   D/C whenmedically stable   Anticipated DC Date:  02/09/2012   Anticipated DC Plan:  HOME W HOME HEALTH SERVICES      DC Planning Services  CM consult      PAC Choice  DURABLE MEDICAL EQUIPMENT  HOME HEALTH   Choice offered to / List presented to:  C-1 Patient   DME arranged  VAC      DME agency  [facility]     [facility] arranged  HH-1 RN      Status of service:  In process, will continue to follow  Comments:  02/06/12, Kathi Der RNC-MNN, BSN, [phone], CM received referral.  CM met with pt to offer choice for [facility] services. Pt. chose [facility].  CM called [facility] and they stated they are out of network with pt's insurance.  Pt's second choice is [facility].  Darl Pikes at [facility] contacted with orders and confirmation of services received.  Pt states that her father and other family members will be able to assist her at home.  Wound Vac has been approved by [facility] and will be delivered to pt's room prior to discharge. 02/03/12, Kathi Der RNC-MNN, BSN, [phone], CM received referral from Wound Care Nurse, Ladona Mow, with wound vac instructions for DME-Wound Vac for staff RN for hospital stay.  CM called and spoke with Clarissa, Care Coordiantor, to be sure staff RN was aware of orders.  Per Clarissa, Care Coordinator, staff RN taking care of pt. is aware of orders.  Will follow for any home needs.

## 2012-02-06 NOTE — Progress Notes (Signed)
CRITICAL VALUE ALERT  Critical value received:  Hgb 6.9  Date of notification:  02/06/2012  Time of notification:  0458  Critical value read back:yes  Nurse who received alert:  Bennetta Laos, RN  MD notified (1st page):  Dr. Carolynne Edouard (CCS, MD)  Time of first page:  0505  MD notified (2nd page):  Time of second page:  Responding MD:  Dr. Carolynne Edouard (CCS, MD)  Time MD responded:  [phone]

## 2012-02-06 NOTE — Progress Notes (Signed)
ANTIBIOTIC CONSULT NOTE - INITIAL  Pharmacy Consult for Zosyn Indication: r/o abscess; s/p surgery for perforated appendix 01/29/12  Allergies  Allergen Reactions  . Sulfa Antibiotics Hives    Patient Measurements: Height: 5\' 11"  (180.3 cm) Weight: 217 lb 6 oz (98.6 kg) IBW/kg (Calculated) : 70.8    Vital Signs: Temp: 98.6 F (37 C) (03/07 0601) Temp src: Oral (03/07 0601) BP: 111/65 mmHg (03/07 0601) Pulse Rate: 88  (03/07 0601)   Labs:  Basename 02/06/12 0430 02/05/12 0443  WBC 14.0* 11.6*  HGB 6.9* 7.0*  PLT 443* 402*  LABCREA -- --  CREATININE -- --   Estimated Creatinine Clearance: 139 ml/min (by C-G formula based on Cr of 0.79).    Microbiology: 3/7: Pelvic fluid - B.fragilis   Medical History: Past Medical History  Diagnosis Date  . Anemia     Medications:  Scheduled:    . ertapenem (INVANZ) IV  1 g Intravenous Q24H  . Flora-Q  1 capsule Oral Daily  . fluconazole  400 mg Oral Daily  . multivitamins with iron  1 tablet Oral Daily  . naproxen  500 mg Oral BID WC  . psyllium  1 packet Oral BID  . sodium chloride  3 mL Intravenous Q12H  . DISCONTD: enoxaparin (LOVENOX) injection  40 mg Subcutaneous Q24H  . DISCONTD: ertapenem (INVANZ) IV  1 g Intravenous Q24H   Infusions:   PRN: acetaminophen, alum & mag hydroxide-simeth, bisacodyl, diphenhydrAMINE, iohexol, magic mouthwash, morphine, ondansetron (ZOFRAN) IV, ondansetron, oxyCODONE, promethazine, DISCONTD: morphine  Assessment:  26 y/o F s/p surgery 01/29/12 for perforated appendix, now with leukocytosis and low-grade fevers despite ertapenem x 7 days.  CT 3/7 showed possible abscess.   To resume antibiotic therapy with a broader-spectrum agent (Zosyn).  CT-guided drainage planned.  Goal of Therapy:   Zosyn extended-infusion regimen  Plan:   Zosyn 3.375 grams IV q8h (each dose over 4 hours)  Follow SCr and cultures.  Elie Goody, PharmD, BCPS Pager: [phone] 02/06/2012  1:11 PM

## 2012-02-06 NOTE — Progress Notes (Signed)
MD notified pt's results from CT available. Marcelino Duster, RN

## 2012-02-06 NOTE — Progress Notes (Signed)
Patient ID: [name] [name], female   DOB: 1986-06-26, 26 y.o.   MRN: [id number] Requested to evaluate for a percutaneous drain in pelvic fluid collections.  Reviewed the CT abd/pel from 02/06/12.  There are small complex fluid collections along the left side of the rectum and uterus.  These collections are very small and not amendable to drain placement at this time.

## 2012-02-06 NOTE — Progress Notes (Signed)
Notified by the lab of a Critical Lab Value: Hgb 6.9.  MD Carolynne Edouard) notified of the lab & orders given to obtain a Type & Screen. Will continue to monitor the patient.

## 2012-02-07 ENCOUNTER — Inpatient Hospital Stay (HOSPITAL_COMMUNITY): Payer: Managed Care, Other (non HMO)

## 2012-02-07 LAB — CBC
HCT: 22.6 % — ABNORMAL LOW (ref 36.0–46.0)
Hemoglobin: 7.2 g/dL — ABNORMAL LOW (ref 12.0–15.0)
MCHC: 32.3 g/dL (ref 30.0–36.0)
Platelets: 494 10*3/uL — ABNORMAL HIGH (ref 150–400)
RDW: 15.4 % (ref 11.5–15.5)
RDW: 15.6 % — ABNORMAL HIGH (ref 11.5–15.5)
WBC: 25.3 10*3/uL — ABNORMAL HIGH (ref 4.0–10.5)
WBC: 23.6 10*3/uL — ABNORMAL HIGH (ref 4.0–10.5)

## 2012-02-07 LAB — COMPREHENSIVE METABOLIC PANEL
AST: 18 U/L (ref 0–37)
Albumin: 2 g/dL — ABNORMAL LOW (ref 3.5–5.2)
Alkaline Phosphatase: 60 U/L (ref 39–117)
Chloride: 99 mEq/L (ref 96–112)
Potassium: 3.8 mEq/L (ref 3.5–5.1)
Sodium: 137 mEq/L (ref 135–145)
Total Bilirubin: 0.5 mg/dL (ref 0.3–1.2)
Total Protein: 6.4 g/dL (ref 6.0–8.3)

## 2012-02-07 LAB — DIFFERENTIAL
Basophils Relative: 0 % (ref 0–1)
Eosinophils Absolute: 0.2 10*3/uL (ref 0.0–0.7)
Eosinophils Relative: 1 % (ref 0–5)
Monocytes Absolute: 1.4 10*3/uL — ABNORMAL HIGH (ref 0.1–1.0)
Neutro Abs: 20.8 10*3/uL — ABNORMAL HIGH (ref 1.7–7.7)

## 2012-02-07 MED ORDER — ENSURE CLINICAL ST REVIGOR PO LIQD
237.0000 mL | Freq: Two times a day (BID) | ORAL | Status: DC
  Administered 2012-02-08 – 2012-02-10 (×4): 237 mL via ORAL
  Administered 2012-02-12: 15:00:00 via ORAL
  Administered 2012-02-13 – 2012-02-14 (×2): 237 mL via ORAL
  Administered 2012-02-15 (×2): via ORAL
  Administered 2012-02-16 – 2012-02-17 (×3): 237 mL via ORAL

## 2012-02-07 MED ORDER — FUROSEMIDE 40 MG PO TABS
40.0000 mg | ORAL_TABLET | Freq: Once | ORAL | Status: AC
  Administered 2012-02-07: 40 mg via ORAL
  Filled 2012-02-07 (×2): qty 1

## 2012-02-07 MED ORDER — VANCOMYCIN HCL IN DEXTROSE 1-5 GM/200ML-% IV SOLN
1000.0000 mg | Freq: Three times a day (TID) | INTRAVENOUS | Status: DC
  Administered 2012-02-07 – 2012-02-09 (×6): 1000 mg via INTRAVENOUS
  Filled 2012-02-07 (×9): qty 200

## 2012-02-07 MED ORDER — DOCUSATE SODIUM 100 MG PO CAPS
100.0000 mg | ORAL_CAPSULE | Freq: Two times a day (BID) | ORAL | Status: DC
  Filled 2012-02-07 (×2): qty 1

## 2012-02-07 MED ORDER — BISACODYL 10 MG RE SUPP
10.0000 mg | Freq: Every day | RECTAL | Status: DC
  Administered 2012-02-08: 10 mg via RECTAL
  Filled 2012-02-07: qty 1

## 2012-02-07 MED ORDER — POLYETHYLENE GLYCOL 3350 17 G PO PACK
17.0000 g | PACK | Freq: Every day | ORAL | Status: DC
  Filled 2012-02-07: qty 1

## 2012-02-07 NOTE — Progress Notes (Signed)
ANTIBIOTIC CONSULT NOTE - INITIAL  Pharmacy Consult for Vancomycin Indication: rule out pneumonia  Allergies  Allergen Reactions  . Sulfa Antibiotics Hives    Patient Measurements: Height: 5\' 11"  (180.3 cm) Weight: 217 lb 6 oz (98.6 kg) IBW/kg (Calculated) : 70.8   Vital Signs: Temp: 97.3 F (36.3 C) (03/08 1400) Temp src: Oral (03/08 1400) BP: 120/75 mmHg (03/08 1400) Pulse Rate: 74  (03/08 1400)  Labs:  Basename 02/07/12 1134 02/07/12 0411 02/06/12 0430  WBC 23.6* 25.3* 14.0*  HGB 7.0* 7.2* 6.9*  PLT 494* 470* 443*  LABCREA -- -- --  CREATININE 0.72 -- --   Estimated Creatinine Clearance: 139 ml/min (by C-G formula based on Cr of 0.72). No results found for this basename: VANCOTROUGH:2,VANCOPEAK:2,VANCORANDOM:2,GENTTROUGH:2,GENTPEAK:2,GENTRANDOM:2,TOBRATROUGH:2,TOBRAPEAK:2,TOBRARND:2,AMIKACINPEAK:2,AMIKACINTROU:2,AMIKACIN:2, in the last 72 hours   Microbiology: Recent Results (from the past 720 hour(s))  CULTURE, BLOOD (ROUTINE X 2)     Status: Normal   Collection Time   01/29/12  3:00 PM      Component Value Range Status Comment   Specimen Description BLOOD RIGHT ANTECUBITAL   Final    Special Requests NONE BOTTLES DRAWN AEROBIC AND ANAEROBIC [facility]   Final    Culture  Setup Time [id number]   Final    Culture NO GROWTH 5 DAYS   Final    Report Status 02/04/2012 FINAL   Final   ANAEROBIC CULTURE     Status: Normal   Collection Time   01/29/12  9:03 PM      Component Value Range Status Comment   Specimen Description PELVIS   Final    Special Requests NONE   Final    Gram Stain     Final    Value: ABUNDANT WBC PRESENT, PREDOMINANTLY PMN     NO SQUAMOUS EPITHELIAL CELLS SEEN     NO ORGANISMS SEEN   Culture     Final    Value: RARE BACTEROIDES FRAGILIS     Note: BETA LACTAMASE POSITIVE   Report Status 02/05/2012 FINAL   Final   CULTURE, ROUTINE-ABSCESS     Status: Normal   Collection Time   01/29/12  9:03 PM      Component Value Range Status Comment   Specimen Description PELVIS   Final    Special Requests NONE   Final    Gram Stain     Final    Value: ABUNDANT WBC PRESENT, PREDOMINANTLY PMN     NO SQUAMOUS EPITHELIAL CELLS SEEN     NO ORGANISMS SEEN   Culture NO GROWTH 3 DAYS   Final    Report Status 02/02/2012 FINAL   Final   MRSA PCR SCREENING     Status: Normal   Collection Time   01/30/12  4:06 AM      Component Value Range Status Comment   MRSA by PCR NEGATIVE  NEGATIVE  Final     Medical History: Past Medical History  Diagnosis Date  . Anemia     Assessment: 25yof s/p perforated appendix and ex lap with LLL infiltrate and increased WBC on day #2 Zosyn, now adding vancomycin.  CrCl>100 mL/min and appears stable.  Goal of Therapy:  Vancomycin trough level 15-20 mcg/ml  Plan:  Vancomycin 1g IV q8h.  Clance Boll 02/07/2012,5:09 PM

## 2012-02-07 NOTE — Consult Note (Signed)
WOC consult Note Reason for Consult:Mid-abdominal wound with NPWT Wound type:Surgical Measurement:9.5 x 5.5 x 2.5 Wound bed: moist, pink to red, suture no longer visible in wound bed, some friability at 12o'clock, granulating and contracting Drainage (amount, consistency, odor) minimal serosanguinous Periwound:intact Dressing procedure/placement/frequency: Pubic hair trimmed with patient consent and drape applied surperior to public hair.  Small black foam dressing applied, 125 mmHg continuous suction applied, tolerated well.  Patient pre-medicated prior to procedure with a pain rating of 5/10.  Post dressing change, patient reports 4/10. PA asked if 8 staples could be removed today and he said we would wait a bit longer; we will ask if that can be done on Monday with NPWT dressing change (POD #11). I will follow along with you.   Thanks, Ladona Mow, MSN, RN, [facility], CWOCN [phone])

## 2012-02-07 NOTE — Progress Notes (Signed)
INITIAL ADULT NUTRITION ASSESSMENT Date: 02/07/2012   Time: 9:08 AM Reason for Assessment: Poor intake  ASSESSMENT: Female 26 y.o.  Dx: Perforated appendicitis  Hx:  Past Medical History  Diagnosis Date  . Anemia    Related Meds:  Scheduled Meds:   . docusate sodium  100 mg Oral BID  . Flora-Q  1 capsule Oral Daily  . fluconazole  400 mg Oral Daily  . multivitamins with iron  1 tablet Oral Daily  . naproxen  500 mg Oral BID WC  . piperacillin-tazobactam (ZOSYN)  IV  3.375 g Intravenous Q8H  . polyethylene glycol  17 g Oral Daily  . psyllium  1 packet Oral BID  . sodium chloride  3 mL Intravenous Q12H  . DISCONTD: ertapenem (INVANZ) IV  1 g Intravenous Q24H   Continuous Infusions:  PRN Meds:.acetaminophen, alum & mag hydroxide-simeth, bisacodyl, diphenhydrAMINE, iohexol, magic mouthwash, morphine, ondansetron (ZOFRAN) IV, ondansetron, oxyCODONE, promethazine  Ht: 5\' 11"  (180.3 cm)  Wt: 217 lb 6 oz (98.6 kg)  Ideal Wt: 155 lb % Ideal Wt: 140  Usual Wt: 218 lb % Usual Wt: 99  Body mass index is 30.32 kg/(m^2).  Food/Nutrition Related Hx: Pt admitted with 5 days of abdominal pain, nausea, vomiting, and fever, found to have perforated appendix with terminal ileum and left fallopian tube involvement. POD # 9 laparoscopy, laparotomy, unilateral salpingectomy, and application of wound vac. NGT was placed after surgery and d/c on 3/2. Pt developed post-op ileus and diet was gradually advanced. Pt currently on regular diet with intake 25% of meals. Pt denies any nausea but states her appetite is still poor, but pt is trying to eat.   CT of abdomen/pelvis on 3/7 showed: 1. Status post appendectomy, left salpingectomy and left-sided  ovarian cyst resection with diffuse mesenteric edema, edema of the  mesocolon, and small volume of ascites, as above. In addition, in  the left hemipelvis there is a complex multilocular rim enhancing  fluid collection. Whether or not this simply  represents resolving  postoperative loculated seromas, or small abscesses is uncertain.  The extensive stranding within the mesocolon and mesentery could  suggest peritonitis. Clinical correlation is recommended.  2. Multiple borderline dilated loops of small bowel, as above,  suggestive of mild postoperative ileus.  3. Probable moderate-sized left-sided pleural effusion with  associated passive atelectasis of the left lower lobe.  Labs:  CMP     Component Value Date/Time   NA 135 02/02/2012 0422   K 3.6 02/02/2012 0422   CL 103 02/02/2012 0422   CO2 27 02/02/2012 0422   GLUCOSE 122* 02/02/2012 0422   BUN 5* 02/02/2012 0422   CREATININE 0.79 02/02/2012 0422   CALCIUM 8.4 02/02/2012 0422   PROT 7.8 01/29/2012 1505   ALBUMIN 2.8* 01/29/2012 1505   AST 17 01/29/2012 1505   ALT 11 01/29/2012 1505   ALKPHOS 58 01/29/2012 1505   BILITOT 0.8 01/29/2012 1505   GFRNONAA >90 02/02/2012 0422   GFRAA >90 02/02/2012 0422   - Albumin likely low r/t inflammatory process of surgery and wound   Intake/Output Summary (Last 24 hours) at 02/07/12 0932 Last data filed at 02/06/12 1748  Gross per 24 hour  Intake    360 ml  Output    755 ml  Net   -395 ml   Last BM - 02/04/12   Diet Order: General  IVF:    Estimated Nutritional Needs:   Kcal: 1800-2150 Protein: 105-125g Fluid: 1.8-2.1L  NUTRITION DIAGNOSIS: -Inadequate oral  intake (NI-2.1).  Status: Ongoing  RELATED TO: poor appetite  AS EVIDENCE BY: 25% meal intake  MONITORING/EVALUATION(Goals): Pt to consume >75% of meals/supplements.   EDUCATION NEEDS: -Education needs addressed - reviewed importance dietary protein for wound healing and provided handout of this information. Reviewed sources of protein in the diet. Pt expressed understanding.   INTERVENTION: Chocolate/Strawberry Ensure Clinical Strength BID. Encouraged gradual increased intake. Will monitor.   Dietitian #: [id number]  DOCUMENTATION CODES Per approved criteria  -Obesity  Unspecified    Marshall Cork 02/07/2012, 9:08 AM

## 2012-02-07 NOTE — Progress Notes (Signed)
9 Days Post-Op   Subjective: Tolerating reg diet w/o N/V. Pain controlled with orals. Feels superficially swollen in her lower abdomen, flanks but no increased pain.  Objective: Vital signs in last 24 hours: Temp:  [97.5 F (36.4 C)-102.3 F (39.1 C)] 97.7 F (36.5 C) (03/08 0600) Pulse Rate:  [75-80] 75  (03/08 0600) Resp:  [19-20] 19  (03/08 0600) BP: (104-110)/(42-60) 104/56 mmHg (03/08 0600) SpO2:  [92 %-100 %] 98 % (03/08 0600) Last BM Date: 02/04/12  Intake/Output from previous day: 03/07 0701 - 03/08 0700 In: 420 [P.O.:420] Out: 1255 [Urine:1250; Drains:5]  General appearance: alert and no distress Resp: clear to auscultation bilaterally Cardio: regular rate and rhythm GI: Soft, +BS. VAC in place, incision C/D/I.  Lab Results:   Basename 02/07/12 0411 02/06/12 0430  WBC 25.3* 14.0*  HGB 7.2* 6.9*  HCT 22.6* 21.5*  PLT 470* 443*    Studies/Results: Ct Abdomen Pelvis W Contrast  02/06/2012  *RADIOLOGY REPORT*  Clinical Data: Abdominal pain.  Evaluate for potential abscess, leak or obstruction.Status post appendectomy and removal of ovarian cyst and salpingo activity on 01/09/2012.  CT ABDOMEN AND PELVIS WITH CONTRAST  Technique:  Multidetector CT imaging of the abdomen and pelvis was performed following the standard protocol during bolus administration of intravenous contrast.  Contrast: OMNIPAQUE IOHEXOL 300 MG/ML IJ SOLN  Comparison: CT of abdomen and pelvis 01/29/2012.  Findings:  Lung Bases: Incompletely visualized left pleural effusion (likely moderate size), with associated passive atelectasis in the left lower lobe.  Borderline cardiomegaly.  Abdomen/Pelvis:  The enhanced appearance of the liver, gallbladder, pancreas, spleen, bilateral adrenal glands and bilateral kidneys is unremarkable.  Compared to the prior examination there are now postoperative changes related to prior appendectomy, left salpingectomy and the left ovarian cyst removal.  There is a healing  midline abdominal wound, and a surgical drain in place entering the left lower quadrant of the abdomen with tip of drain extending into the right pericolic gutter (terminating near the hepatic flexure of the colon).  In the left side of the pelvis anterior to the distal sigmoid colon, and to the left of the uterus there is a complex multilocular low attenuation (0-5 HU) fluid collection with complex at thick rim of enhancement.  There is some adjacent thickening of the distal sigmoid colon, presumably reactive.  Diffuse stranding is noted throughout the mesentery, particularly involving the mesocolon (most pronounced within the ascending mesocolon).  In the right pericolic gutter there is also some focal fluid which has some subtle peripheral enhancement, without frank loculation.  No pathologic distension of bowel was identified, although there are several prominent bowel loops measuring up to 2.9 cm in diameter in the left upper quadrant of the abdomen.  Gas and stool, as well as residual oral contrast material, is noted throughout the colon, extending to the level of the rectum.  Urinary bladder is unremarkable in appearance.  Musculoskeletal: There are no aggressive appearing lytic or blastic lesions noted in the visualized portions of the skeleton.  IMPRESSION: 1.  Status post appendectomy, left salpingectomy and left-sided ovarian cyst resection with diffuse mesenteric edema, edema of the mesocolon, and small volume of ascites, as above.  In addition, in the left hemipelvis there is a complex multilocular rim enhancing fluid collection.  Whether or not this simply represents resolving postoperative loculated seromas, or small abscesses is uncertain. The extensive stranding within the mesocolon and mesentery could suggest peritonitis.  Clinical correlation is recommended. 2.  Multiple borderline dilated loops  of small bowel, as above, suggestive of mild postoperative ileus. 3.  Probable moderate-sized  left-sided pleural effusion with associated passive atelectasis of the left lower lobe.  These results will be called to the ordering clinician or representative by the Radiologist Assistant, and communication documented in the PACS Dashboard. .  Original Report Authenticated By: Florencia Reasons, M.D.    Assessment/Plan: Acute appendicitis w/perforation s/p open appy w/SB,colon resection Left ovarian cyst s/p cystectomy, salpingectomy ID -- WBC, temps up. Had been on Invanz x8d, now on Zosyn D#2, oral diflucan. Intraabdominal abscesses not amenable to drainage. Monitor progress. Left pleural effusion -- Asymptomatic ABL anemia -- Stable FEN -- Tolerating reg diet VTE -- SCD's. Lovenox stopped because of anemia.     LOS: 9 days    [name] Garofano [phone]. [phone]  02/07/2012

## 2012-02-07 NOTE — Progress Notes (Addendum)
Pt tolerating PO OK but inc WBC a concern Recheck CBC to see if not lab error Check 3 way to r/o free air Try diuresis w Lasix since +I/O and effusion  D/C drain - low output & no abscess seen Simplify diet for now Simplify bowel regimen  If not better, gastrograffin enema to r/o anastomotic leak

## 2012-02-08 LAB — CBC
HCT: 21.4 % — ABNORMAL LOW (ref 36.0–46.0)
Hemoglobin: 6.6 g/dL — CL (ref 12.0–15.0)
MCHC: 30.8 g/dL (ref 30.0–36.0)
MCV: 69 fL — ABNORMAL LOW (ref 78.0–100.0)
RDW: 15.8 % — ABNORMAL HIGH (ref 11.5–15.5)
WBC: 14.1 10*3/uL — ABNORMAL HIGH (ref 4.0–10.5)

## 2012-02-08 MED ORDER — FERROUS SULFATE 220 (44 FE) MG/5ML PO ELIX
220.0000 mg | ORAL_SOLUTION | Freq: Three times a day (TID) | ORAL | Status: DC
  Filled 2012-02-08 (×3): qty 5

## 2012-02-08 MED ORDER — FERROUS SULFATE 300 (60 FE) MG/5ML PO SYRP
300.0000 mg | ORAL_SOLUTION | Freq: Three times a day (TID) | ORAL | Status: DC
  Administered 2012-02-08 – 2012-02-09 (×5): 300 mg via ORAL
  Filled 2012-02-08 (×8): qty 5

## 2012-02-08 NOTE — Progress Notes (Signed)
Patient ID: [name] [name], female   DOB: January 17, 1986, 25 y.o.   MRN: [id number] [facility] Progress Note:   10 Days Post-Op  Subjective: Mental status is clear Objective: Vital signs in last 24 hours: Temp:  [97.3 F (36.3 C)-97.6 F (36.4 C)] 97.6 F (36.4 C) (03/09 0544) Pulse Rate:  [62-74] 62  (03/09 0544) Resp:  [18-19] 18  (03/09 0544) BP: (115-120)/(64-75) 115/64 mmHg (03/09 0544) SpO2:  [96 %] 96 % (03/09 0544)  Intake/Output from previous day: 03/08 0701 - 03/09 0700 In: 103 [P.O.:100; I.V.:3] Out: 4050 [Urine:4050] Intake/Output this shift:    Physical Exam: Work of breathing is  Normal.  Wound vac in place.  Having some swelling in thighs  Lab Results:  Results for orders placed during the hospital encounter of 01/29/12 (from the past 48 hour(s))  CBC     Status: Abnormal   Collection Time   02/07/12  4:11 AM      Component Value Range Comment   WBC 25.3 (*) 4.0 - 10.5 (K/uL)    RBC 3.33 (*) 3.87 - 5.11 (MIL/uL)    Hemoglobin 7.2 (*) 12.0 - 15.0 (g/dL)    HCT 40.9 (*) 81.1 - 46.0 (%)    MCV 67.9 (*) 78.0 - 100.0 (fL)    MCH 21.6 (*) 26.0 - 34.0 (pg)    MCHC 31.9  30.0 - 36.0 (g/dL)    RDW 91.4  78.2 - 95.6 (%)    Platelets 470 (*) 150 - 400 (K/uL)   CBC     Status: Abnormal   Collection Time   02/07/12 11:34 AM      Component Value Range Comment   WBC 23.6 (*) 4.0 - 10.5 (K/uL)    RBC 3.18 (*) 3.87 - 5.11 (MIL/uL)    Hemoglobin 7.0 (*) 12.0 - 15.0 (g/dL)    HCT 21.3 (*) 08.6 - 46.0 (%)    MCV 68.2 (*) 78.0 - 100.0 (fL)    MCH 22.0 (*) 26.0 - 34.0 (pg)    MCHC 32.3  30.0 - 36.0 (g/dL)    RDW 57.8 (*) 46.9 - 15.5 (%)    Platelets 494 (*) 150 - 400 (K/uL)   COMPREHENSIVE METABOLIC PANEL     Status: Abnormal   Collection Time   02/07/12 11:34 AM      Component Value Range Comment   Sodium 137  135 - 145 (mEq/L)    Potassium 3.8  3.5 - 5.1 (mEq/L)    Chloride 99  96 - 112 (mEq/L)    CO2 31  19 - 32 (mEq/L)    Glucose, Bld 101 (*) 70 - 99 (mg/dL)     BUN 3 (*) 6 - 23 (mg/dL)    Creatinine, Ser 6.29  0.50 - 1.10 (mg/dL)    Calcium 8.6  8.4 - 10.5 (mg/dL)    Total Protein 6.4  6.0 - 8.3 (g/dL)    Albumin 2.0 (*) 3.5 - 5.2 (g/dL)    AST 18  0 - 37 (U/L)    ALT 9  0 - 35 (U/L)    Alkaline Phosphatase 60  39 - 117 (U/L)    Total Bilirubin 0.5  0.3 - 1.2 (mg/dL)    GFR calc non Af Amer >90  >90 (mL/min)    GFR calc Af Amer >90  >90 (mL/min)   DIFFERENTIAL     Status: Abnormal   Collection Time   02/07/12 11:34 AM      Component Value Range Comment  Neutrophils Relative 88 (*) 43 - 77 (%)    Lymphocytes Relative 5 (*) 12 - 46 (%)    Monocytes Relative 6  3 - 12 (%)    Eosinophils Relative 1  0 - 5 (%)    Basophils Relative 0  0 - 1 (%)    Neutro Abs 20.8 (*) 1.7 - 7.7 (K/uL)    Lymphs Abs 1.2  0.7 - 4.0 (K/uL)    Monocytes Absolute 1.4 (*) 0.1 - 1.0 (K/uL)    Eosinophils Absolute 0.2  0.0 - 0.7 (K/uL)    Basophils Absolute 0.0  0.0 - 0.1 (K/uL)    WBC Morphology TOXIC GRANULATION   VACUOLATED NEUTROPHILS   Smear Review LARGE PLATELETS PRESENT     CBC     Status: Abnormal   Collection Time   02/08/12  4:53 AM      Component Value Range Comment   WBC 14.1 (*) 4.0 - 10.5 (K/uL)    RBC 3.10 (*) 3.87 - 5.11 (MIL/uL)    Hemoglobin 6.6 (*) 12.0 - 15.0 (g/dL)    HCT 82.9 (*) 56.2 - 46.0 (%)    MCV 69.0 (*) 78.0 - 100.0 (fL)    MCH 21.3 (*) 26.0 - 34.0 (pg)    MCHC 30.8  30.0 - 36.0 (g/dL)    RDW 13.0 (*) 86.5 - 15.5 (%)    Platelets 487 (*) 150 - 400 (K/uL)   POTASSIUM     Status: Normal   Collection Time   02/08/12  4:53 AM      Component Value Range Comment   Potassium 3.8  3.5 - 5.1 (mEq/L)     Radiology/Results: Ct Abdomen Pelvis W Contrast  02/06/2012  *RADIOLOGY REPORT*  Clinical Data: Abdominal pain.  Evaluate for potential abscess, leak or obstruction.Status post appendectomy and removal of ovarian cyst and salpingo activity on 01/09/2012.  CT ABDOMEN AND PELVIS WITH CONTRAST  Technique:  Multidetector CT imaging of the  abdomen and pelvis was performed following the standard protocol during bolus administration of intravenous contrast.  Contrast: OMNIPAQUE IOHEXOL 300 MG/ML IJ SOLN  Comparison: CT of abdomen and pelvis 01/29/2012.  Findings:  Lung Bases: Incompletely visualized left pleural effusion (likely moderate size), with associated passive atelectasis in the left lower lobe.  Borderline cardiomegaly.  Abdomen/Pelvis:  The enhanced appearance of the liver, gallbladder, pancreas, spleen, bilateral adrenal glands and bilateral kidneys is unremarkable.  Compared to the prior examination there are now postoperative changes related to prior appendectomy, left salpingectomy and the left ovarian cyst removal.  There is a healing midline abdominal wound, and a surgical drain in place entering the left lower quadrant of the abdomen with tip of drain extending into the right pericolic gutter (terminating near the hepatic flexure of the colon).  In the left side of the pelvis anterior to the distal sigmoid colon, and to the left of the uterus there is a complex multilocular low attenuation (0-5 HU) fluid collection with complex at thick rim of enhancement.  There is some adjacent thickening of the distal sigmoid colon, presumably reactive.  Diffuse stranding is noted throughout the mesentery, particularly involving the mesocolon (most pronounced within the ascending mesocolon).  In the right pericolic gutter there is also some focal fluid which has some subtle peripheral enhancement, without frank loculation.  No pathologic distension of bowel was identified, although there are several prominent bowel loops measuring up to 2.9 cm in diameter in the left upper quadrant of the abdomen.  Gas  and stool, as well as residual oral contrast material, is noted throughout the colon, extending to the level of the rectum.  Urinary bladder is unremarkable in appearance.  Musculoskeletal: There are no aggressive appearing lytic or blastic  lesions noted in the visualized portions of the skeleton.  IMPRESSION: 1.  Status post appendectomy, left salpingectomy and left-sided ovarian cyst resection with diffuse mesenteric edema, edema of the mesocolon, and small volume of ascites, as above.  In addition, in the left hemipelvis there is a complex multilocular rim enhancing fluid collection.  Whether or not this simply represents resolving postoperative loculated seromas, or small abscesses is uncertain. The extensive stranding within the mesocolon and mesentery could suggest peritonitis.  Clinical correlation is recommended. 2.  Multiple borderline dilated loops of small bowel, as above, suggestive of mild postoperative ileus. 3.  Probable moderate-sized left-sided pleural effusion with associated passive atelectasis of the left lower lobe.  These results will be called to the ordering clinician or representative by the Radiologist Assistant, and communication documented in the PACS Dashboard. .  Original Report Authenticated By: Florencia Reasons, M.D.   Dg Abd Acute W/chest  02/07/2012  *RADIOLOGY REPORT*  Clinical Data: Abdominal pain.  Recent surgery for ruptured appendix.  ACUTE ABDOMEN SERIES (ABDOMEN 2 VIEW & CHEST 1 VIEW)  Comparison: CT 02/06/2012  Findings: Left lower lobe infiltrate and small left effusion.  This is unchanged from the recent CT.  Negative for heart failure. Small right pleural effusion.  Negative for bowel obstruction.  Oral contrast in the colon from recent CT.  Negative for pneumoperitoneum.  No air-fluid levels on the upright view.  No bony abnormality.  IMPRESSION: Left lower lobe infiltrate and effusion.  Possible pneumonia versus atelectasis.  Nonobstructive bowel gas pattern.  Negative for pneumoperitoneum.  Original Report Authenticated By: Camelia Phenes, M.D.    Anti-infectives: Anti-infectives     Start     Dose/Rate Route Frequency Ordered Stop   02/07/12 1800   vancomycin (VANCOCIN) IVPB 1000 mg/200 mL  premix        1,000 mg 200 mL/hr over 60 Minutes Intravenous Every 8 hours 02/07/12 1715     02/06/12 1400  piperacillin-tazobactam (ZOSYN) IVPB 3.375 g       3.375 g 12.5 mL/hr over 240 Minutes Intravenous 3 times per day 02/06/12 1312     02/06/12 1330   fluconazole (DIFLUCAN) tablet 400 mg        400 mg Oral Daily 02/06/12 1234     02/06/12 0700   ertapenem (INVANZ) 1 g in sodium chloride 0.9 % 50 mL IVPB  Status:  Discontinued        1 g 100 mL/hr over 30 Minutes Intravenous Every 24 hours 02/06/12 0650 02/06/12 1234   01/30/12 2000   ertapenem (INVANZ) 1 g in sodium chloride 0.9 % 50 mL IVPB        1 g 100 mL/hr over 30 Minutes Intravenous Every 24 hours 01/30/12 0155 02/05/12 2125   01/30/12 0154   ertapenem (INVANZ) 1 g in sodium chloride 0.9 % 50 mL IVPB  Status:  Discontinued        1 g 100 mL/hr over 30 Minutes Intravenous Every 24 hours 01/30/12 0154 01/30/12 0207   01/29/12 2045   ertapenem (INVANZ) 1 g in sodium chloride 0.9 % 50 mL IVPB  Status:  Discontinued        1 g 100 mL/hr over 30 Minutes Intravenous On call to O.R. 01/29/12 2033 01/30/12  1610   01/29/12 1530  piperacillin-tazobactam (ZOSYN) IVPB 3.375 g       3.375 g 12.5 mL/hr over 240 Minutes Intravenous  Once 01/29/12 1526 01/29/12 1931          Assessment/Plan: Problem List: Patient Active Problem List  Diagnoses  . Ileus following gastrointestinal surgery    HG 6.6 this am.  Will order feosol.  Pt has exophthalmos--will order thyroid profile 10 Days Post-Op    LOS: 10 days   Matt B. Daphine Deutscher, MD, [facility] [phone] beeper [phone]  02/08/2012 7:24 AM

## 2012-02-09 LAB — VANCOMYCIN, TROUGH: Vancomycin Tr: 14.8 ug/mL (ref 10.0–20.0)

## 2012-02-09 LAB — CBC
HCT: 22.2 % — ABNORMAL LOW (ref 36.0–46.0)
Hemoglobin: 7 g/dL — ABNORMAL LOW (ref 12.0–15.0)
MCHC: 31.5 g/dL (ref 30.0–36.0)
RBC: 3.23 MIL/uL — ABNORMAL LOW (ref 3.87–5.11)

## 2012-02-09 LAB — TSH: TSH: 2.05 u[IU]/mL (ref 0.350–4.500)

## 2012-02-09 LAB — T4, FREE: Free T4: 1.59 ng/dL (ref 0.80–1.80)

## 2012-02-09 MED ORDER — VANCOMYCIN HCL IN DEXTROSE 1-5 GM/200ML-% IV SOLN
1000.0000 mg | Freq: Three times a day (TID) | INTRAVENOUS | Status: DC
  Administered 2012-02-09 – 2012-02-12 (×9): 1000 mg via INTRAVENOUS
  Filled 2012-02-09 (×8): qty 200

## 2012-02-09 NOTE — Progress Notes (Signed)
Patient ID: [name] [name], female   DOB: May 14, 1986, 26 y.o.   MRN: [id number] [facility] Progress Note:   11 Days Post-Op  Subjective: Mental status is clear Objective: Vital signs in last 24 hours: Temp:  [97.8 F (36.6 C)-98.8 F (37.1 C)] 98 F (36.7 C) (03/10 0528) Pulse Rate:  [67-97] 67  (03/10 0528) Resp:  [18] 18  (03/10 0528) BP: (116-130)/(62-69) 130/62 mmHg (03/10 0528) SpO2:  [96 %-98 %] 97 % (03/10 0528)  Intake/Output from previous day: 03/09 0701 - 03/10 0700 In: 2052 [P.O.:600; IV Piggyback:1452] Out: 4700 [Urine:4700] Intake/Output this shift: Total I/O In: -  Out: 600 [Urine:600]  Physical Exam: Work of breathing is  Normal.  Wound vac in place.    Lab Results:  Results for orders placed during the hospital encounter of 01/29/12 (from the past 48 hour(s))  CBC     Status: Abnormal   Collection Time   02/07/12 11:34 AM      Component Value Range Comment   WBC 23.6 (*) 4.0 - 10.5 (K/uL)    RBC 3.18 (*) 3.87 - 5.11 (MIL/uL)    Hemoglobin 7.0 (*) 12.0 - 15.0 (g/dL)    HCT 78.2 (*) 95.6 - 46.0 (%)    MCV 68.2 (*) 78.0 - 100.0 (fL)    MCH 22.0 (*) 26.0 - 34.0 (pg)    MCHC 32.3  30.0 - 36.0 (g/dL)    RDW 21.3 (*) 08.6 - 15.5 (%)    Platelets 494 (*) 150 - 400 (K/uL)   COMPREHENSIVE METABOLIC PANEL     Status: Abnormal   Collection Time   02/07/12 11:34 AM      Component Value Range Comment   Sodium 137  135 - 145 (mEq/L)    Potassium 3.8  3.5 - 5.1 (mEq/L)    Chloride 99  96 - 112 (mEq/L)    CO2 31  19 - 32 (mEq/L)    Glucose, Bld 101 (*) 70 - 99 (mg/dL)    BUN 3 (*) 6 - 23 (mg/dL)    Creatinine, Ser 5.78  0.50 - 1.10 (mg/dL)    Calcium 8.6  8.4 - 10.5 (mg/dL)    Total Protein 6.4  6.0 - 8.3 (g/dL)    Albumin 2.0 (*) 3.5 - 5.2 (g/dL)    AST 18  0 - 37 (U/L)    ALT 9  0 - 35 (U/L)    Alkaline Phosphatase 60  39 - 117 (U/L)    Total Bilirubin 0.5  0.3 - 1.2 (mg/dL)    GFR calc non Af Amer >90  >90 (mL/min)    GFR calc Af Amer >90  >90  (mL/min)   DIFFERENTIAL     Status: Abnormal   Collection Time   02/07/12 11:34 AM      Component Value Range Comment   Neutrophils Relative 88 (*) 43 - 77 (%)    Lymphocytes Relative 5 (*) 12 - 46 (%)    Monocytes Relative 6  3 - 12 (%)    Eosinophils Relative 1  0 - 5 (%)    Basophils Relative 0  0 - 1 (%)    Neutro Abs 20.8 (*) 1.7 - 7.7 (K/uL)    Lymphs Abs 1.2  0.7 - 4.0 (K/uL)    Monocytes Absolute 1.4 (*) 0.1 - 1.0 (K/uL)    Eosinophils Absolute 0.2  0.0 - 0.7 (K/uL)    Basophils Absolute 0.0  0.0 - 0.1 (K/uL)    WBC Morphology  TOXIC GRANULATION   VACUOLATED NEUTROPHILS   Smear Review LARGE PLATELETS PRESENT     CBC     Status: Abnormal   Collection Time   02/08/12  4:53 AM      Component Value Range Comment   WBC 14.1 (*) 4.0 - 10.5 (K/uL)    RBC 3.10 (*) 3.87 - 5.11 (MIL/uL)    Hemoglobin 6.6 (*) 12.0 - 15.0 (g/dL)    HCT 91.4 (*) 78.2 - 46.0 (%)    MCV 69.0 (*) 78.0 - 100.0 (fL)    MCH 21.3 (*) 26.0 - 34.0 (pg)    MCHC 30.8  30.0 - 36.0 (g/dL)    RDW 95.6 (*) 21.3 - 15.5 (%)    Platelets 487 (*) 150 - 400 (K/uL)   POTASSIUM     Status: Normal   Collection Time   02/08/12  4:53 AM      Component Value Range Comment   Potassium 3.8  3.5 - 5.1 (mEq/L)   TSH     Status: Normal   Collection Time   02/08/12  4:18 PM      Component Value Range Comment   TSH 2.050  0.350 - 4.500 (uIU/mL)   T4, FREE     Status: Normal   Collection Time   02/08/12  4:18 PM      Component Value Range Comment   Free T4 1.59  0.80 - 1.80 (ng/dL)   CBC     Status: Abnormal   Collection Time   02/09/12  4:30 AM      Component Value Range Comment   WBC 11.9 (*) 4.0 - 10.5 (K/uL)    RBC 3.23 (*) 3.87 - 5.11 (MIL/uL)    Hemoglobin 7.0 (*) 12.0 - 15.0 (g/dL)    HCT 08.6 (*) 57.8 - 46.0 (%)    MCV 68.7 (*) 78.0 - 100.0 (fL)    MCH 21.7 (*) 26.0 - 34.0 (pg)    MCHC 31.5  30.0 - 36.0 (g/dL)    RDW 46.9 (*) 62.9 - 15.5 (%)    Platelets 522 (*) 150 - 400 (K/uL)     Radiology/Results: Dg Abd Acute  W/chest  02/07/2012  *RADIOLOGY REPORT*  Clinical Data: Abdominal pain.  Recent surgery for ruptured appendix.  ACUTE ABDOMEN SERIES (ABDOMEN 2 VIEW & CHEST 1 VIEW)  Comparison: CT 02/06/2012  Findings: Left lower lobe infiltrate and small left effusion.  This is unchanged from the recent CT.  Negative for heart failure. Small right pleural effusion.  Negative for bowel obstruction.  Oral contrast in the colon from recent CT.  Negative for pneumoperitoneum.  No air-fluid levels on the upright view.  No bony abnormality.  IMPRESSION: Left lower lobe infiltrate and effusion.  Possible pneumonia versus atelectasis.  Nonobstructive bowel gas pattern.  Negative for pneumoperitoneum.  Original Report Authenticated By: Camelia Phenes, M.D.    Anti-infectives: Anti-infectives     Start     Dose/Rate Route Frequency Ordered Stop   02/07/12 1800   vancomycin (VANCOCIN) IVPB 1000 mg/200 mL premix        1,000 mg 200 mL/hr over 60 Minutes Intravenous Every 8 hours 02/07/12 1715     02/06/12 1400  piperacillin-tazobactam (ZOSYN) IVPB 3.375 g       3.375 g 12.5 mL/hr over 240 Minutes Intravenous 3 times per day 02/06/12 1312     02/06/12 1330   fluconazole (DIFLUCAN) tablet 400 mg        400 mg Oral Daily 02/06/12  1234     02/06/12 0700   ertapenem (INVANZ) 1 g in sodium chloride 0.9 % 50 mL IVPB  Status:  Discontinued        1 g 100 mL/hr over 30 Minutes Intravenous Every 24 hours 02/06/12 0650 02/06/12 1234   01/30/12 2000   ertapenem (INVANZ) 1 g in sodium chloride 0.9 % 50 mL IVPB        1 g 100 mL/hr over 30 Minutes Intravenous Every 24 hours 01/30/12 0155 02/05/12 2125   01/30/12 0154   ertapenem (INVANZ) 1 g in sodium chloride 0.9 % 50 mL IVPB  Status:  Discontinued        1 g 100 mL/hr over 30 Minutes Intravenous Every 24 hours 01/30/12 0154 01/30/12 0207   01/29/12 2045   ertapenem (INVANZ) 1 g in sodium chloride 0.9 % 50 mL IVPB  Status:  Discontinued        1 g 100 mL/hr over 30 Minutes  Intravenous On call to O.R. 01/29/12 2033 01/30/12 0209   01/29/12 1530  piperacillin-tazobactam (ZOSYN) IVPB 3.375 g       3.375 g 12.5 mL/hr over 240 Minutes Intravenous  Once 01/29/12 1526 01/29/12 1931          Assessment/Plan: Problem List: Patient Active Problem List  Diagnoses  . Ileus following gastrointestinal surgery    WBC down. Afebrile.  Would hope that she could be discharged soon.  Taking Feosol and Hg stable at 7.   11 Days Post-Op    LOS: 11 days   Matt B. Daphine Deutscher, MD, [facility] [phone] beeper [phone]  02/09/2012 8:25 AM

## 2012-02-09 NOTE — Progress Notes (Signed)
ANTIBIOTIC CONSULT NOTE - INITIAL  Pharmacy Consult for Vancomycin Indication: rule out pneumonia  Allergies  Allergen Reactions  . Sulfa Antibiotics Hives    Patient Measurements: Height: 5\' 11"  (180.3 cm) Weight: 217 lb 6 oz (98.6 kg) IBW/kg (Calculated) : 70.8   Vital Signs: Temp: 98 F (36.7 C) (03/10 0528) Temp src: Oral (03/10 0528) BP: 130/62 mmHg (03/10 0528) Pulse Rate: 67  (03/10 0528)  Labs:  Basename 02/09/12 0430 02/08/12 0453 02/07/12 1134  WBC 11.9* 14.1* 23.6*  HGB 7.0* 6.6* 7.0*  PLT 522* 487* 494*  LABCREA -- -- --  CREATININE -- -- 0.72   Estimated Creatinine Clearance: 139 ml/min (by C-G formula based on Cr of 0.72).  Basename 02/09/12 1008  VANCOTROUGH 14.8  VANCOPEAK --  VANCORANDOM --  GENTTROUGH --  GENTPEAK --  GENTRANDOM --  TOBRATROUGH --  TOBRAPEAK --  TOBRARND --  AMIKACINPEAK --  AMIKACINTROU --  AMIKACIN --     Microbiology: Recent Results (from the past 720 hour(s))  CULTURE, BLOOD (ROUTINE X 2)     Status: Normal   Collection Time   01/29/12  3:00 PM      Component Value Range Status Comment   Specimen Description BLOOD RIGHT ANTECUBITAL   Final    Special Requests NONE BOTTLES DRAWN AEROBIC AND ANAEROBIC [facility]   Final    Culture  Setup Time [id number]   Final    Culture NO GROWTH 5 DAYS   Final    Report Status 02/04/2012 FINAL   Final   ANAEROBIC CULTURE     Status: Normal   Collection Time   01/29/12  9:03 PM      Component Value Range Status Comment   Specimen Description PELVIS   Final    Special Requests NONE   Final    Gram Stain     Final    Value: ABUNDANT WBC PRESENT, PREDOMINANTLY PMN     NO SQUAMOUS EPITHELIAL CELLS SEEN     NO ORGANISMS SEEN   Culture     Final    Value: RARE BACTEROIDES FRAGILIS     Note: BETA LACTAMASE POSITIVE   Report Status 02/05/2012 FINAL   Final   CULTURE, ROUTINE-ABSCESS     Status: Normal   Collection Time   01/29/12  9:03 PM      Component Value Range Status Comment     Specimen Description PELVIS   Final    Special Requests NONE   Final    Gram Stain     Final    Value: ABUNDANT WBC PRESENT, PREDOMINANTLY PMN     NO SQUAMOUS EPITHELIAL CELLS SEEN     NO ORGANISMS SEEN   Culture NO GROWTH 3 DAYS   Final    Report Status 02/02/2012 FINAL   Final   MRSA PCR SCREENING     Status: Normal   Collection Time   01/30/12  4:06 AM      Component Value Range Status Comment   MRSA by PCR NEGATIVE  NEGATIVE  Final     Medical History: Past Medical History  Diagnosis Date  . Anemia     Assessment:  25yof s/p perforated appendix and ex lap with LLL infiltrate and increased WBC on day #4 Zosyn and Day #3 Vancomycin.  CrCl>100 mL/min and appears stable.  WBC improved  Vancomycin Trough essentially therapeutic  Goal of Therapy:  Vancomycin trough level 15-20 mcg/ml  Plan:  1. Vanc Trough just below goal of 15-20  at 14.8. Continue Vancomycin 1g IV q8. Will recheck a SCr in AM since last one checked was 3/8 2. Continue current Zosyn dosing   Hessie Knows, PharmD, BCPS pager [phone] 02/09/2012 11:58 AM

## 2012-02-10 DIAGNOSIS — D649 Anemia, unspecified: Secondary | ICD-10-CM | POA: Diagnosis not present

## 2012-02-10 LAB — CBC
HCT: 22.3 % — ABNORMAL LOW (ref 36.0–46.0)
Hemoglobin: 6.8 g/dL — CL (ref 12.0–15.0)
MCV: 68.8 fL — ABNORMAL LOW (ref 78.0–100.0)
WBC: 11.6 10*3/uL — ABNORMAL HIGH (ref 4.0–10.5)

## 2012-02-10 LAB — CREATININE, SERUM
GFR calc Af Amer: 81 mL/min — ABNORMAL LOW (ref >90)
GFR calc non Af Amer: 70 mL/min — ABNORMAL LOW (ref >90)

## 2012-02-10 MED ORDER — FERROUS FUMARATE 325 (106 FE) MG PO TABS
1.0000 | ORAL_TABLET | Freq: Two times a day (BID) | ORAL | Status: DC
  Administered 2012-02-10 – 2012-02-13 (×6): 106 mg via ORAL
  Filled 2012-02-10 (×10): qty 1

## 2012-02-10 NOTE — Progress Notes (Signed)
Patient seen and examined.  Agree with PA's note.   If WBC still elevated tomorrow, will get CT.

## 2012-02-10 NOTE — Consult Note (Signed)
WOC consult Note Reason for Consult:Surgical dressing with NPWT Wound type:Mid-abdominal wound V.A.C. Dressing changed today with staff RN assisting.  Will Marlyne Beards, PA, in to see patient during dressing change.  8 staples removed today and steri-strips placed.  Wound bed is clean, pink and granulating. Patient tolerated dressing and staple removal, small black foam placement and 125 mmHg continuous pressure initiation well. I will follow along with you. Thanks, Ladona Mow, MSN, RN, [facility], CWOCN [phone])

## 2012-02-10 NOTE — Progress Notes (Signed)
CRITICAL VALUE ALERT  Critical value received:  Hemoglobin 6.8  Date of notification:  02-10-12  Time of notification:  0544  Critical value read back:yes  Nurse who received alert:  Corene Cornea  MD notified (1st page):  [facility]  Time of first page:  [phone]  MD notified (2nd page):  Time of second page:  Responding MD:  Daphine Deutscher  Time MD responded:  [phone]

## 2012-02-10 NOTE — Progress Notes (Signed)
12 Days Post-Op  Subjective: Afebrile, VSS, WBC slowly improving, Ongoing anemia, +BM Over all she is doing better and feels better, The iron make her nauseated.  Abdominal wound is less tender, the drain is out. Objective: Vital signs in last 24 hours: Temp:  [97.8 F (36.6 C)-99.6 F (37.6 C)] 97.8 F (36.6 C) (03/11 0500) Pulse Rate:  [69-96] 69  (03/11 0500) Resp:  [18] 18  (03/11 0500) BP: (113-130)/(71-76) 113/71 mmHg (03/11 0500) SpO2:  [96 %-98 %] 98 % (03/11 0500) Last BM Date: 02/09/12  Intake/Output from previous day: 03/10 0701 - 03/11 0700 In: 990 [P.O.:240; IV Piggyback:750] Out: 3400 [Urine:3400] Intake/Output this shift:    PE:  Alert, large family in room.  Chest clear  Abd:  Soft, not really tender, + BS, staples and wound vac in Place.  Lab Results:   Basename 02/10/12 0510 02/09/12 0430  WBC 11.6* 11.9*  HGB 6.8* 7.0*  HCT 22.3* 22.2*  PLT 521* 522*    Lab 02/07/12 1134  AST 18  ALT 9  ALKPHOS 60  BILITOT 0.5  PROT 6.4  ALBUMIN 2.0*    BMET  Basename 02/10/12 0510 02/08/12 0453 02/07/12 1134  NA -- -- 137  K -- 3.8 3.8  CL -- -- 99  CO2 -- -- 31  GLUCOSE -- -- 101*  BUN -- -- 3*  CREATININE 1.09 -- 0.72  CALCIUM -- -- 8.6   PT/INR No results found for this basename: LABPROT:2,INR:2 in the last 72 hours   Studies/Results: No results found.  Anti-infectives: Anti-infectives     Start     Dose/Rate Route Frequency Ordered Stop   02/09/12 2000   vancomycin (VANCOCIN) IVPB 1000 mg/200 mL premix        1,000 mg 200 mL/hr over 60 Minutes Intravenous Every 8 hours 02/09/12 1705     02/07/12 1800   vancomycin (VANCOCIN) IVPB 1000 mg/200 mL premix  Status:  Discontinued        1,000 mg 200 mL/hr over 60 Minutes Intravenous Every 8 hours 02/07/12 1715 02/09/12 1705   02/06/12 1400  piperacillin-tazobactam (ZOSYN) IVPB 3.375 g       3.375 g 12.5 mL/hr over 240 Minutes Intravenous 3 times per day 02/06/12 1312     02/06/12 1330    fluconazole (DIFLUCAN) tablet 400 mg        400 mg Oral Daily 02/06/12 1234     02/06/12 0700   ertapenem (INVANZ) 1 g in sodium chloride 0.9 % 50 mL IVPB  Status:  Discontinued        1 g 100 mL/hr over 30 Minutes Intravenous Every 24 hours 02/06/12 0650 02/06/12 1234   01/30/12 2000   ertapenem (INVANZ) 1 g in sodium chloride 0.9 % 50 mL IVPB        1 g 100 mL/hr over 30 Minutes Intravenous Every 24 hours 01/30/12 0155 02/05/12 2125   01/30/12 0154   ertapenem (INVANZ) 1 g in sodium chloride 0.9 % 50 mL IVPB  Status:  Discontinued        1 g 100 mL/hr over 30 Minutes Intravenous Every 24 hours 01/30/12 0154 01/30/12 0207   01/29/12 2045   ertapenem (INVANZ) 1 g in sodium chloride 0.9 % 50 mL IVPB  Status:  Discontinued        1 g 100 mL/hr over 30 Minutes Intravenous On call to O.R. 01/29/12 2033 01/30/12 0209   01/29/12 1530  piperacillin-tazobactam (ZOSYN) IVPB 3.375 g  3.375 g 12.5 mL/hr over 240 Minutes Intravenous  Once 01/29/12 1526 01/29/12 1931         Current Facility-Administered Medications  Medication Dose Route Frequency Provider Last Rate Last Dose  . acetaminophen (TYLENOL) tablet 325-650 mg  325-650 mg Oral Q6H PRN Ardeth Sportsman, MD   650 mg at 02/07/12 0014  . alum & mag hydroxide-simeth (MAALOX/MYLANTA) 200-200-20 MG/5ML suspension 30 mL  30 mL Oral Q6H PRN Ardeth Sportsman, MD      . bisacodyl (DULCOLAX) suppository 10 mg  10 mg Rectal Q12H PRN Ardeth Sportsman, MD      . bisacodyl (DULCOLAX) suppository 10 mg  10 mg Rectal Daily Ardeth Sportsman, MD   10 mg at 02/08/12 1219  . diphenhydrAMINE (BENADRYL) injection 12.5-25 mg  12.5-25 mg Intravenous Q6H PRN Ardeth Sportsman, MD      . feeding supplement (ENSURE CLINICAL STRENGTH) liquid 237 mL  237 mL Oral BID BM Lavena Bullion, RD   237 mL at 02/09/12 1031  . ferrous sulfate 300 (60 FE) MG/5ML syrup 300 mg  300 mg Oral TID WC Valarie Merino, MD   300 mg at 02/09/12 1851  . Flora-Q (FLORA-Q) Capsule 1  capsule  1 capsule Oral Daily Ardeth Sportsman, MD   1 capsule at 02/09/12 1202  . fluconazole (DIFLUCAN) tablet 400 mg  400 mg Oral Daily Ardeth Sportsman, MD   400 mg at 02/09/12 1203  . magic mouthwash  15 mL Oral QID PRN Ardeth Sportsman, MD      . morphine 4 MG/ML injection 4 mg  4 mg Intravenous Q2H PRN Ardeth Sportsman, MD      . multivitamins with iron tablet 1 tablet  1 tablet Oral Daily Ardeth Sportsman, MD   1 tablet at 02/09/12 1203  . naproxen (NAPROSYN) tablet 500 mg  500 mg Oral BID WC Ardeth Sportsman, MD   500 mg at 02/09/12 1851  . ondansetron (ZOFRAN) tablet 4 mg  4 mg Oral Q6H PRN Caleen Essex III, MD       Or  . ondansetron [facility]) injection 4 mg  4 mg Intravenous Q6H PRN Caleen Essex III, MD   4 mg at 02/09/12 0720  . oxyCODONE (Oxy IR/ROXICODONE) immediate release tablet 5-15 mg  5-15 mg Oral Q4H PRN Freeman Caldron, PA   10 mg at 02/10/12 0521  . piperacillin-tazobactam (ZOSYN) IVPB 3.375 g  3.375 g Intravenous Q8H Randall K Absher, PHARMD   3.375 g at 02/10/12 0521  . promethazine (PHENERGAN) injection 12.5-25 mg  12.5-25 mg Intravenous Q6H PRN Ardeth Sportsman, MD   25 mg at 02/06/12 0454  . psyllium (HYDROCIL/METAMUCIL) packet 1 packet  1 packet Oral BID Ardeth Sportsman, MD   1 packet at 02/07/12 1214  . sodium chloride 0.9 % injection 3 mL  3 mL Intravenous Q12H Freeman Caldron, PA   3 mL at 02/07/12 2219  . vancomycin (VANCOCIN) IVPB 1000 mg/200 mL premix  1,000 mg Intravenous Q8H Maryanna Shape Runyon, PHARMD   1,000 mg at 02/10/12 0414  . DISCONTD: vancomycin (VANCOCIN) IVPB 1000 mg/200 mL premix  1,000 mg Intravenous Q8H Maryanna Shape Runyon, PHARMD   1,000 mg at 02/09/12 1027    Assessment/Plan: Pod# 12 perforated appendix with terminal ileum and left fallopian tube involvement 01/29/12 s/p Procedure(s) (LRB):  LAPAROSCOPY DIAGNOSTIC (N/A)  EXPLORATORY LAPAROTOMY (N/A)  UNILATERAL SALPINGECTOMY (Left)  APPLICATION OF WOUND VAC ()  01/29/12 Dr. Carolynne Edouard & Dr. Jean Rosenthal  Christell Constant Acute appendicitis w/perforation s/p open appy w/SB,colon resection  Left ovarian cyst s/p cystectomy, salpingectomy  ID -- WBC, temps up. Had been on Invanz x8d, now on Zosyn D#2, oral diflucan. Intraabdominal abscesses not amenable to drainage. Monitor progress. Last CT 02/06/12. Left pleural effusion -- Asymptomatic  ABL anemia -- Stable  FEN -- Tolerating reg diet  VTE -- SCD's. Lovenox stopped because of anemia.  Anemia -  Admit: 01/29/12  H/H= 9.5/28.8 -- 3/1 (8.3/26.5)  02/10/12 (6.8/22.3) Possible pnemonia;  LLL infiltrate, and effusion vs pneumonia,  Plan:  Over all she is doing well.  WBC is still up tomorrow will be 5 days, since last CT.  Will discuss repeating tomorrow with Dr. Abbey Chatters, and changing to PO antibiotics..  Try to find another oral Fe, that doesn't make her sick. Currently on Zosyn, for abd and Vancomycin for pnemonia  LOS: 12 days    [name] [name] 02/10/2012

## 2012-02-11 ENCOUNTER — Inpatient Hospital Stay (HOSPITAL_COMMUNITY): Payer: Managed Care, Other (non HMO)

## 2012-02-11 LAB — CBC
HCT: 21.7 % — ABNORMAL LOW (ref 36.0–46.0)
Hemoglobin: 6.7 g/dL — CL (ref 12.0–15.0)
MCHC: 30.9 g/dL (ref 30.0–36.0)
RBC: 3.16 MIL/uL — ABNORMAL LOW (ref 3.87–5.11)
WBC: 12.6 10*3/uL — ABNORMAL HIGH (ref 4.0–10.5)

## 2012-02-11 MED ORDER — IOHEXOL 300 MG/ML  SOLN
100.0000 mL | Freq: Once | INTRAMUSCULAR | Status: AC | PRN
  Administered 2012-02-11: 100 mL via INTRAVENOUS

## 2012-02-11 NOTE — Progress Notes (Signed)
Nutrition Brief Note  - Pt on regular diet eating 75-90% of meals and drinking 1 Ensure Clinical Strength daily. Pt states she is not able to eat all of her meals because she gets too full, but her appetite is improving. Pt reports choosing foods high in protein for wound healing. Encouraged continued excellent meal and supplement intake. Will monitor.   Dietitian # [phone]

## 2012-02-11 NOTE — Plan of Care (Signed)
Problem: Inadequate Intake (NI-2.1) Goal: Food and/or nutrient delivery Individualized approach for food/nutrient provision.  Outcome: Progressing Meal intake 75-90% of meals, 100% supplement intake

## 2012-02-11 NOTE — Progress Notes (Signed)
Patient seen and examined.  Agree with PA's note.  Waiting on CT scan.

## 2012-02-11 NOTE — Progress Notes (Signed)
13 Days Post-Op  Subjective: Afebrile, VSS, No BM recorded, on zosyn & Vancomycin day 4.  WBC still elevated. She is eating some, not allot.  She doesn't really feel bad.  Objective: Vital signs in last 24 hours: Temp:  [97.9 F (36.6 C)-98.5 F (36.9 C)] 98.2 F (36.8 C) (03/12 0545) Pulse Rate:  [58-81] 58  (03/12 0545) Resp:  [18-20] 20  (03/12 0545) BP: (100-114)/(63-79) 100/64 mmHg (03/12 0545) SpO2:  [97 %] 97 % (03/12 0545) Last BM Date: 02/09/12  Intake/Output from previous day: 03/11 0701 - 03/12 0700 In: 540 [P.O.:540] Out: 3450 [Urine:3450] Intake/Output this shift: Total I/O In: -  Out: 300 [Urine:300]  PE: alert,   Chest: clear  Abd:  Wound vac in place  +BS,  not really tender   Lab Results:   Basename 02/11/12 0421 02/10/12 0510  WBC 12.6* 11.6*  HGB 6.7* 6.8*  HCT 21.7* 22.3*  PLT 531* 521*    BMET  Basename 02/10/12 0510  NA --  K --  CL --  CO2 --  GLUCOSE --  BUN --  CREATININE 1.09  CALCIUM --   PT/INR No results found for this basename: LABPROT:2,INR:2 in the last 72 hours   Studies/Results: No results found.  Anti-infectives: Anti-infectives     Start     Dose/Rate Route Frequency Ordered Stop   02/09/12 2000   vancomycin (VANCOCIN) IVPB 1000 mg/200 mL premix        1,000 mg 200 mL/hr over 60 Minutes Intravenous Every 8 hours 02/09/12 1705     02/07/12 1800   vancomycin (VANCOCIN) IVPB 1000 mg/200 mL premix  Status:  Discontinued        1,000 mg 200 mL/hr over 60 Minutes Intravenous Every 8 hours 02/07/12 1715 02/09/12 1705   02/06/12 1400  piperacillin-tazobactam (ZOSYN) IVPB 3.375 g       3.375 g 12.5 mL/hr over 240 Minutes Intravenous 3 times per day 02/06/12 1312     02/06/12 1330   fluconazole (DIFLUCAN) tablet 400 mg        400 mg Oral Daily 02/06/12 1234     02/06/12 0700   ertapenem (INVANZ) 1 g in sodium chloride 0.9 % 50 mL IVPB  Status:  Discontinued        1 g 100 mL/hr over 30 Minutes Intravenous Every 24  hours 02/06/12 0650 02/06/12 1234   01/30/12 2000   ertapenem (INVANZ) 1 g in sodium chloride 0.9 % 50 mL IVPB        1 g 100 mL/hr over 30 Minutes Intravenous Every 24 hours 01/30/12 0155 02/05/12 2125   01/30/12 0154   ertapenem (INVANZ) 1 g in sodium chloride 0.9 % 50 mL IVPB  Status:  Discontinued        1 g 100 mL/hr over 30 Minutes Intravenous Every 24 hours 01/30/12 0154 01/30/12 0207   01/29/12 2045   ertapenem (INVANZ) 1 g in sodium chloride 0.9 % 50 mL IVPB  Status:  Discontinued        1 g 100 mL/hr over 30 Minutes Intravenous On call to O.R. 01/29/12 2033 01/30/12 0209   01/29/12 1530  piperacillin-tazobactam (ZOSYN) IVPB 3.375 g       3.375 g 12.5 mL/hr over 240 Minutes Intravenous  Once 01/29/12 1526 01/29/12 1931         Current Facility-Administered Medications  Medication Dose Route Frequency Provider Last Rate Last Dose  . acetaminophen (TYLENOL) tablet 325-650 mg  325-650 mg Oral  Q6H PRN Ardeth Sportsman, MD   650 mg at 02/07/12 0014  . alum & mag hydroxide-simeth (MAALOX/MYLANTA) 200-200-20 MG/5ML suspension 30 mL  30 mL Oral Q6H PRN Ardeth Sportsman, MD      . bisacodyl (DULCOLAX) suppository 10 mg  10 mg Rectal Q12H PRN Ardeth Sportsman, MD      . bisacodyl (DULCOLAX) suppository 10 mg  10 mg Rectal Daily Ardeth Sportsman, MD   10 mg at 02/08/12 1219  . diphenhydrAMINE (BENADRYL) injection 12.5-25 mg  12.5-25 mg Intravenous Q6H PRN Ardeth Sportsman, MD      . feeding supplement (ENSURE CLINICAL STRENGTH) liquid 237 mL  237 mL Oral BID BM Lavena Bullion, RD   237 mL at 02/10/12 1420  . ferrous fumarate (HEMOCYTE - 106 mg FE) tablet 106 mg of iron  1 tablet Oral BID PC Sherrie George, PA   106 mg of iron at 02/11/12 0937  . Flora-Q (FLORA-Q) Capsule 1 capsule  1 capsule Oral Daily Ardeth Sportsman, MD   1 capsule at 02/11/12 5621  . fluconazole (DIFLUCAN) tablet 400 mg  400 mg Oral Daily Ardeth Sportsman, MD   400 mg at 02/11/12 3086  . magic mouthwash  15 mL Oral QID  PRN Ardeth Sportsman, MD      . morphine 4 MG/ML injection 4 mg  4 mg Intravenous Q2H PRN Ardeth Sportsman, MD      . multivitamins with iron tablet 1 tablet  1 tablet Oral Daily Ardeth Sportsman, MD   1 tablet at 02/11/12 0937  . naproxen (NAPROSYN) tablet 500 mg  500 mg Oral BID WC Ardeth Sportsman, MD   500 mg at 02/11/12 0855  . ondansetron (ZOFRAN) tablet 4 mg  4 mg Oral Q6H PRN Caleen Essex III, MD       Or  . ondansetron [facility]) injection 4 mg  4 mg Intravenous Q6H PRN Caleen Essex III, MD   4 mg at 02/09/12 0720  . oxyCODONE (Oxy IR/ROXICODONE) immediate release tablet 5-15 mg  5-15 mg Oral Q4H PRN Freeman Caldron, PA   10 mg at 02/10/12 2206  . piperacillin-tazobactam (ZOSYN) IVPB 3.375 g  3.375 g Intravenous Q8H Randall K Absher, PHARMD   3.375 g at 02/11/12 0544  . promethazine (PHENERGAN) injection 12.5-25 mg  12.5-25 mg Intravenous Q6H PRN Ardeth Sportsman, MD   25 mg at 02/10/12 1540  . psyllium (HYDROCIL/METAMUCIL) packet 1 packet  1 packet Oral BID Ardeth Sportsman, MD   1 packet at 02/07/12 1214  . sodium chloride 0.9 % injection 3 mL  3 mL Intravenous Q12H Freeman Caldron, PA   3 mL at 02/10/12 0901  . vancomycin (VANCOCIN) IVPB 1000 mg/200 mL premix  1,000 mg Intravenous Q8H Maryanna Shape Runyon, PHARMD   1,000 mg at 02/11/12 0403    Assessment/Plan perforated appendix with terminal ileum and left fallopian tube involvement 01/29/12  s/p Procedure(s) (LRB):  LAPAROSCOPY DIAGNOSTIC (N/A)  EXPLORATORY LAPAROTOMY (N/A)  UNILATERAL SALPINGECTOMY (Left)  APPLICATION OF WOUND VAC () 01/29/12 Dr. Carolynne Edouard & Dr. Jean Rosenthal Christell Constant  Acute appendicitis w/perforation s/p open appy w/SB,colon resection  Left ovarian cyst s/p cystectomy, salpingectomy  ID -- WBC, temps up. Had been on Invanz x8d, now on Zosyn D#2, oral diflucan. Intraabdominal abscesses not amenable to drainage. Monitor progress. Last CT 02/06/12.  Left pleural effusion -- Asymptomatic  ABL anemia -- Stable  FEN -- Tolerating reg diet  VTE -- SCD's. Lovenox stopped because of anemia.  Anemia - Admit: 01/29/12 H/H= 9.5/28.8 -- 3/1 (8.3/26.5) 02/10/12 (6.8/22.3) Possible pnemonia; LLL infiltrate, and effusion vs pneumonia,   Plan:  CT ordered for today.  Continue antibiotics    LOS: 13 days    [name] [name] 02/11/2012

## 2012-02-12 LAB — CBC
MCH: 21.7 pg — ABNORMAL LOW (ref 26.0–34.0)
Platelets: 544 10*3/uL — ABNORMAL HIGH (ref 150–400)
RBC: 3 MIL/uL — ABNORMAL LOW (ref 3.87–5.11)
WBC: 11.9 10*3/uL — ABNORMAL HIGH (ref 4.0–10.5)

## 2012-02-12 LAB — VANCOMYCIN, TROUGH: Vancomycin Tr: 26.8 ug/mL (ref 10.0–20.0)

## 2012-02-12 MED ORDER — METRONIDAZOLE IN NACL 5-0.79 MG/ML-% IV SOLN
500.0000 mg | Freq: Three times a day (TID) | INTRAVENOUS | Status: DC
  Administered 2012-02-12 – 2012-02-19 (×21): 500 mg via INTRAVENOUS
  Filled 2012-02-12 (×26): qty 100

## 2012-02-12 MED ORDER — MEDROXYPROGESTERONE ACETATE 10 MG PO TABS
20.0000 mg | ORAL_TABLET | Freq: Three times a day (TID) | ORAL | Status: AC
  Administered 2012-02-12 – 2012-02-19 (×21): 20 mg via ORAL
  Filled 2012-02-12 (×21): qty 2

## 2012-02-12 NOTE — Progress Notes (Signed)
ANTIBIOTIC CONSULT NOTE  Pharmacy Consult for Vancomycin Indication: rule out pneumonia  Allergies  Allergen Reactions  . Sulfa Antibiotics Hives    Patient Measurements: Height: 5\' 11"  (180.3 cm) Weight: 217 lb 6 oz (98.6 kg) IBW/kg (Calculated) : 70.8   Vital Signs: Temp: 98.4 F (36.9 C) (03/13 1400) Temp src: Oral (03/13 1400) BP: 118/75 mmHg (03/13 1400) Pulse Rate: 64  (03/13 1400)  Labs:  Basename 02/12/12 0505 02/11/12 0421 02/10/12 0510  WBC 11.9* 12.6* 11.6*  HGB 6.5* 6.7* 6.8*  PLT 544* 531* 521*  LABCREA -- -- --  CREATININE -- -- 1.09   Estimated Creatinine Clearance: 102 ml/min (by C-G formula based on Cr of 1.09).  Basename 02/12/12 1315  VANCOTROUGH 26.8*  VANCOPEAK --  Drue Dun --  GENTTROUGH --  GENTPEAK --  GENTRANDOM --  TOBRATROUGH --  TOBRAPEAK --  TOBRARND --  AMIKACINPEAK --  AMIKACINTROU --  AMIKACIN --     Microbiology: Recent Results (from the past 720 hour(s))  CULTURE, BLOOD (ROUTINE X 2)     Status: Normal   Collection Time   01/29/12  3:00 PM      Component Value Range Status Comment   Specimen Description BLOOD RIGHT ANTECUBITAL   Final    Special Requests NONE BOTTLES DRAWN AEROBIC AND ANAEROBIC [facility]   Final    Culture  Setup Time [id number]   Final    Culture NO GROWTH 5 DAYS   Final    Report Status 02/04/2012 FINAL   Final   ANAEROBIC CULTURE     Status: Normal   Collection Time   01/29/12  9:03 PM      Component Value Range Status Comment   Specimen Description PELVIS   Final    Special Requests NONE   Final    Gram Stain     Final    Value: ABUNDANT WBC PRESENT, PREDOMINANTLY PMN     NO SQUAMOUS EPITHELIAL CELLS SEEN     NO ORGANISMS SEEN   Culture     Final    Value: RARE BACTEROIDES FRAGILIS     Note: BETA LACTAMASE POSITIVE   Report Status 02/05/2012 FINAL   Final   CULTURE, ROUTINE-ABSCESS     Status: Normal   Collection Time   01/29/12  9:03 PM      Component Value Range Status Comment   Specimen Description PELVIS   Final    Special Requests NONE   Final    Gram Stain     Final    Value: ABUNDANT WBC PRESENT, PREDOMINANTLY PMN     NO SQUAMOUS EPITHELIAL CELLS SEEN     NO ORGANISMS SEEN   Culture NO GROWTH 3 DAYS   Final    Report Status 02/02/2012 FINAL   Final   MRSA PCR SCREENING     Status: Normal   Collection Time   01/30/12  4:06 AM      Component Value Range Status Comment   MRSA by PCR NEGATIVE  NEGATIVE  Final     Medical History: Past Medical History  Diagnosis Date  . Anemia     Assessment:  25yof s/p perforated appendix and ex lap with LLL infiltrate and increased WBC on day #6 Zosyn/Vancomycin and day #1 IV Flagyl.  SCr 1.09 (3/11) << 0.72 (3/8).  Had obtained a repeat vanc trough d/t increased SCr.  VT returned high at 26.8 - this was unexpected and question whether SCr had continued to increase since 3/11.  Vancomycin 1g dose was given after VT was drawn as scheduled.   Goal of Therapy:  Vancomycin trough level 15-20 mcg/ml  Plan:  Hold vancomycin and f/u vancomycin level tomorrow.  Clance Boll, PharmD, BCPS Pager: [phone] 02/12/2012 3:02 PM

## 2012-02-12 NOTE — Progress Notes (Signed)
Phlebotomist had trouble collecting lab draw for vanc. PA paged,order received for PICC line placement. Patient refused PICC line,stated she would rather have another phlebotomist attempt to draw blood. 2nd phlebotimist successful in obtaining blood draw. CLum Keas

## 2012-02-12 NOTE — Progress Notes (Signed)
Subjective: Vaginal bleeding--spotting yesterday, like menses today--minimal clotting Objective: Vital signs in last 24 hours: Temp:  [97.5 F (36.4 C)-98.8 F (37.1 C)] 98.8 F (37.1 C) (03/13 4540) Pulse Rate:  [59-74] 74  (03/13 0613) Resp:  [18] 18  (03/13 0613) BP: (109)/(71-74) 109/74 mmHg (03/13 0613) SpO2:  [97 %] 97 % (03/13 0613)   PVB: minimal  Results for orders placed during the hospital encounter of 01/29/12 (from the past 24 hour(s))  CBC     Status: Abnormal   Collection Time   02/12/12  5:05 AM      Component Value Range   WBC 11.9 (*) 4.0 - 10.5 (K/uL)   RBC 3.00 (*) 3.87 - 5.11 (MIL/uL)   Hemoglobin 6.5 (*) 12.0 - 15.0 (g/dL)   HCT 98.1 (*) 19.1 - 46.0 (%)   MCV 69.0 (*) 78.0 - 100.0 (fL)   MCH 21.7 (*) 26.0 - 34.0 (pg)   MCHC 31.4  30.0 - 36.0 (g/dL)   RDW 47.8 (*) 29.5 - 15.5 (%)   Platelets 544 (*) 150 - 400 (K/uL)    Studies/Results:   Ct Abdomen Pelvis W Contrast  02/11/2012  *RADIOLOGY REPORT*  Clinical Data: Abdominal pain, fever  CT ABDOMEN AND PELVIS WITH CONTRAST  Technique:  Multidetector CT imaging of the abdomen and pelvis was performed following the standard protocol during bolus administration of intravenous contrast.  Contrast: OMNIPAQUE IOHEXOL 300 MG/ML IJ SOLN  Comparison: CT 02/06/2012, 01/29/2012  Findings: There is improvement in the atelectasis and pleural fluid at the left lung base.  There is residual mild air space disease. Cannot exclude infection.  No pericardial fluid.  No focal hepatic lesion.  Gallbladder, pancreas, spleen, and adrenal glands are normal.  Kidneys appear normal.  The stomach and small bowel appear normal.  No small bowel obstruction.  The is surgical line at the cecal tip.  No evidence of organized abscess at the cecum.  There is mild pericecal cyst stranding moderate volume stool throughout the colon.  Rectum appears normal.  Abdominal aorta normal caliber.  No retroperitoneal lymphadenopathy.    There are  several small fluid collection in the pelvis superior to the bladder which is similar to prior.  Fluid collections in the posterior cul-de-sac are decreased.  Fluid collection do  not appear to be a large enough to drain at this point.  Largest measures 2.6 x 2.0 cm (image 72 ) which is decreased from 3.5 x 2.3 cm on prior.  Midline ventral wound is noted.  Uterus and bladder grossly normal. No aggressive osseous lesions.  IMPRESSION:  1.Several small fluid collections/ abscess remain in the pelvis superior to the bladder.  These are not of drainable size.  2. Fluid collections in the posterior cul-de-sac and decreased in size. 3.  Improved atelectasis and effusion at the left lung base. Cannot exclude underlying air space disease.  Original Report Authenticated By: Genevive Bi, M.D.    Scheduled Meds:   . bisacodyl  10 mg Rectal Daily  . feeding supplement  237 mL Oral BID BM  . ferrous fumarate  1 tablet Oral BID PC  . Flora-Q  1 capsule Oral Daily  . fluconazole  400 mg Oral Daily  . metronidazole  500 mg Intravenous Q8H  . multivitamins with iron  1 tablet Oral Daily  . naproxen  500 mg Oral BID WC  . piperacillin-tazobactam (ZOSYN)  IV  3.375 g Intravenous Q8H  . psyllium  1 packet Oral BID  .  sodium chloride  3 mL Intravenous Q12H  . vancomycin  1,000 mg Intravenous Q8H   Continuous Infusions:  PRN Meds:acetaminophen, alum & mag hydroxide-simeth, bisacodyl, diphenhydrAMINE, iohexol, magic mouthwash, morphine, ondansetron (ZOFRAN) IV, ondansetron, oxyCODONE, promethazine  Assessment/Plan: Unscheduled bleed in setting of paratubal abscess/ruptured appendix H/O menorrhagia Severe anemia at present--normal TFTs Leukocytosis trending down, pelvic abscesses smaller  -->check Prolactin -->Provera to arrest bleeding    LOS: 14 days   JACKSON-MOORE,Riely Oetken A

## 2012-02-12 NOTE — Progress Notes (Signed)
Patient seen and examined.  CT shows that the fluid collections are a little smaller.  I do not think a reoperation would be safe at this time given the dense adhesion formation that occurs at this time postop.  Will add Flagyl for thorough anaerobic coverage.  Will ask GYN to check her for the vagina bleeding.

## 2012-02-12 NOTE — Progress Notes (Signed)
Patient discussed at the Long Length of Stay [name] [name] 02/12/2012  

## 2012-02-12 NOTE — Progress Notes (Signed)
14 Days Post-Op  Subjective: C/O clotted blood from Vagina, like her period; last day of last period 2/27. Some nausea this AM eating now. VSS, Daily BM WBC still up slightly, H/H about the same. Tolerating soft diet  Objective: Vital signs in last 24 hours: Temp:  [97.5 F (36.4 C)-98.8 F (37.1 C)] 98.8 F (37.1 C) (03/13 0613) Pulse Rate:  [59-74] 74  (03/13 0613) Resp:  [18] 18  (03/13 0613) BP: (109)/(71-74) 109/74 mmHg (03/13 0613) SpO2:  [97 %] 97 % (03/13 0613) Last BM Date: 02/12/12  Intake/Output from previous day: 03/12 0701 - 03/13 0700 In: 120 [P.O.:120] Out: 2600 [Urine:2600] Intake/Output this shift:    PE:  Alert up in bed, some nausea, but that is better.  Chest: Clear  Abd:  Soft, + BS, not distended.  Wound vac in place, it looked very good on Monday.  Lab Results:   Basename 02/12/12 0505 02/11/12 0421  WBC 11.9* 12.6*  HGB 6.5* 6.7*  HCT 20.7* 21.7*  PLT 544* 531*    BMET  Basename 02/10/12 0510  NA --  K --  CL --  CO2 --  GLUCOSE --  BUN --  CREATININE 1.09  CALCIUM --   PT/INR No results found for this basename: LABPROT:2,INR:2 in the last 72 hours   Studies/Results: Ct Abdomen Pelvis W Contrast  02/11/2012  *RADIOLOGY REPORT*  Clinical Data: Abdominal pain, fever  CT ABDOMEN AND PELVIS WITH CONTRAST  Technique:  Multidetector CT imaging of the abdomen and pelvis was performed following the standard protocol during bolus administration of intravenous contrast.  Contrast: OMNIPAQUE IOHEXOL 300 MG/ML IJ SOLN  Comparison: CT 02/06/2012, 01/29/2012  Findings: There is improvement in the atelectasis and pleural fluid at the left lung base.  There is residual mild air space disease. Cannot exclude infection.  No pericardial fluid.  No focal hepatic lesion.  Gallbladder, pancreas, spleen, and adrenal glands are normal.  Kidneys appear normal.  The stomach and small bowel appear normal.  No small bowel obstruction.  The is surgical line at  the cecal tip.  No evidence of organized abscess at the cecum.  There is mild pericecal cyst stranding moderate volume stool throughout the colon.  Rectum appears normal.  Abdominal aorta normal caliber.  No retroperitoneal lymphadenopathy.    There are several small fluid collection in the pelvis superior to the bladder which is similar to prior.  Fluid collections in the posterior cul-de-sac are decreased.  Fluid collection do  not appear to be a large enough to drain at this point.  Largest measures 2.6 x 2.0 cm (image 72 ) which is decreased from 3.5 x 2.3 cm on prior.  Midline ventral wound is noted.  Uterus and bladder grossly normal. No aggressive osseous lesions.  IMPRESSION:  1.Several small fluid collections/ abscess remain in the pelvis superior to the bladder.  These are not of drainable size.  2. Fluid collections in the posterior cul-de-sac and decreased in size. 3.  Improved atelectasis and effusion at the left lung base. Cannot exclude underlying air space disease.  Original Report Authenticated By: Genevive Bi, M.D.    Anti-infectives: Anti-infectives     Start     Dose/Rate Route Frequency Ordered Stop   02/09/12 2000   vancomycin (VANCOCIN) IVPB 1000 mg/200 mL premix        1,000 mg 200 mL/hr over 60 Minutes Intravenous Every 8 hours 02/09/12 1705     02/07/12 1800   vancomycin (VANCOCIN) IVPB  1000 mg/200 mL premix  Status:  Discontinued        1,000 mg 200 mL/hr over 60 Minutes Intravenous Every 8 hours 02/07/12 1715 02/09/12 1705   02/06/12 1400  piperacillin-tazobactam (ZOSYN) IVPB 3.375 g       3.375 g 12.5 mL/hr over 240 Minutes Intravenous 3 times per day 02/06/12 1312     02/06/12 1330   fluconazole (DIFLUCAN) tablet 400 mg        400 mg Oral Daily 02/06/12 1234     02/06/12 0700   ertapenem (INVANZ) 1 g in sodium chloride 0.9 % 50 mL IVPB  Status:  Discontinued        1 g 100 mL/hr over 30 Minutes Intravenous Every 24 hours 02/06/12 0650 02/06/12 1234    01/30/12 2000   ertapenem (INVANZ) 1 g in sodium chloride 0.9 % 50 mL IVPB        1 g 100 mL/hr over 30 Minutes Intravenous Every 24 hours 01/30/12 0155 02/05/12 2125   01/30/12 0154   ertapenem (INVANZ) 1 g in sodium chloride 0.9 % 50 mL IVPB  Status:  Discontinued        1 g 100 mL/hr over 30 Minutes Intravenous Every 24 hours 01/30/12 0154 01/30/12 0207   01/29/12 2045   ertapenem (INVANZ) 1 g in sodium chloride 0.9 % 50 mL IVPB  Status:  Discontinued        1 g 100 mL/hr over 30 Minutes Intravenous On call to O.R. 01/29/12 2033 01/30/12 0209   01/29/12 1530  piperacillin-tazobactam (ZOSYN) IVPB 3.375 g       3.375 g 12.5 mL/hr over 240 Minutes Intravenous  Once 01/29/12 1526 01/29/12 1931         Current Facility-Administered Medications  Medication Dose Route Frequency Provider Last Rate Last Dose  . acetaminophen (TYLENOL) tablet 325-650 mg  325-650 mg Oral Q6H PRN Ardeth Sportsman, MD   650 mg at 02/07/12 0014  . alum & mag hydroxide-simeth (MAALOX/MYLANTA) 200-200-20 MG/5ML suspension 30 mL  30 mL Oral Q6H PRN Ardeth Sportsman, MD      . bisacodyl (DULCOLAX) suppository 10 mg  10 mg Rectal Q12H PRN Ardeth Sportsman, MD      . bisacodyl (DULCOLAX) suppository 10 mg  10 mg Rectal Daily Ardeth Sportsman, MD   10 mg at 02/08/12 1219  . diphenhydrAMINE (BENADRYL) injection 12.5-25 mg  12.5-25 mg Intravenous Q6H PRN Ardeth Sportsman, MD      . feeding supplement (ENSURE CLINICAL STRENGTH) liquid 237 mL  237 mL Oral BID BM Lavena Bullion, RD   237 mL at 02/10/12 1420  . ferrous fumarate (HEMOCYTE - 106 mg FE) tablet 106 mg of iron  1 tablet Oral BID PC Sherrie George, PA   106 mg of iron at 02/11/12 1818  . Flora-Q (FLORA-Q) Capsule 1 capsule  1 capsule Oral Daily Ardeth Sportsman, MD   1 capsule at 02/11/12 1324  . fluconazole (DIFLUCAN) tablet 400 mg  400 mg Oral Daily Ardeth Sportsman, MD   400 mg at 02/11/12 4010  . iohexol (OMNIPAQUE) 300 MG/ML solution 100 mL  100 mL Intravenous  Once PRN Medication Radiologist, MD   100 mL at 02/11/12 1634  . magic mouthwash  15 mL Oral QID PRN Ardeth Sportsman, MD      . morphine 4 MG/ML injection 4 mg  4 mg Intravenous Q2H PRN Ardeth Sportsman, MD      .  multivitamins with iron tablet 1 tablet  1 tablet Oral Daily Ardeth Sportsman, MD   1 tablet at 02/11/12 0937  . naproxen (NAPROSYN) tablet 500 mg  500 mg Oral BID WC Ardeth Sportsman, MD   500 mg at 02/11/12 1651  . ondansetron (ZOFRAN) tablet 4 mg  4 mg Oral Q6H PRN Caleen Essex III, MD       Or  . ondansetron [facility]) injection 4 mg  4 mg Intravenous Q6H PRN Caleen Essex III, MD   4 mg at 02/11/12 1345  . oxyCODONE (Oxy IR/ROXICODONE) immediate release tablet 5-15 mg  5-15 mg Oral Q4H PRN Freeman Caldron, PA   10 mg at 02/12/12 0813  . piperacillin-tazobactam (ZOSYN) IVPB 3.375 g  3.375 g Intravenous Q8H Randall K Absher, PHARMD   3.375 g at 02/12/12 0555  . promethazine (PHENERGAN) injection 12.5-25 mg  12.5-25 mg Intravenous Q6H PRN Ardeth Sportsman, MD   25 mg at 02/10/12 1540  . psyllium (HYDROCIL/METAMUCIL) packet 1 packet  1 packet Oral BID Ardeth Sportsman, MD   1 packet at 02/07/12 1214  . sodium chloride 0.9 % injection 3 mL  3 mL Intravenous Q12H Freeman Caldron, PA   3 mL at 02/10/12 0901  . vancomycin (VANCOCIN) IVPB 1000 mg/200 mL premix  1,000 mg Intravenous Q8H Maryanna Shape Runyon, PHARMD   1,000 mg at 02/12/12 1610    Assessment/Plan perforated appendix with terminal ileum and left fallopian tube involvement 01/29/12  s/p Procedure(s) (LRB):  LAPAROSCOPY DIAGNOSTIC (N/A)  EXPLORATORY LAPAROTOMY (N/A)  UNILATERAL SALPINGECTOMY (Left)  APPLICATION OF WOUND VAC () 01/29/12 Dr. Carolynne Edouard & Dr. Jean Rosenthal Christell Constant  Acute appendicitis w/perforation s/p open appy w/SB,colon resection  Left ovarian cyst s/p cystectomy, salpingectomy  ID -- WBC, temps up. Had been on Invanz x8d, now on Zosyn D#2, oral diflucan. Intraabdominal abscesses not amenable to drainage. Monitor progress. Last CT  02/06/12.  Left pleural effusion -- Asymptomatic  ABL anemia -- Stable  FEN -- Tolerating reg diet  VTE -- SCD's. Lovenox stopped because of anemia.  Anemia - Admit: 01/29/12 H/H= 9.5/28.8 -- 3/1 (8.3/26.5) 02/10/12 (6.8/22.3) Possible pnemonia; LLL infiltrate, and effusion vs pneumonia,   Plan: Fluid collections, and/abscees in pelvis still present, not drainable fluid in cul de sac better, atelectasis improved. Plan to continue antibiotics, diflucan, for now.   If she has further bleeding from Vagina ask Dr. Tamela Oddi to see again.     LOS: 14 days    Knut Rondinelli 02/12/2012

## 2012-02-13 LAB — BASIC METABOLIC PANEL
CO2: 29 mEq/L (ref 19–32)
Calcium: 8.9 mg/dL (ref 8.4–10.5)
Glucose, Bld: 86 mg/dL (ref 70–99)
Sodium: 138 mEq/L (ref 135–145)

## 2012-02-13 LAB — CREATININE, SERUM: Creatinine, Ser: 1.57 mg/dL — ABNORMAL HIGH (ref 0.50–1.10)

## 2012-02-13 MED ORDER — SODIUM CHLORIDE 0.9 % IV BOLUS (SEPSIS)
1000.0000 mL | Freq: Once | INTRAVENOUS | Status: AC
  Administered 2012-02-13: 1000 mL via INTRAVENOUS

## 2012-02-13 MED ORDER — VANCOMYCIN HCL 1000 MG IV SOLR
1250.0000 mg | INTRAVENOUS | Status: DC
  Administered 2012-02-13: 1250 mg via INTRAVENOUS
  Filled 2012-02-13 (×2): qty 1250

## 2012-02-13 MED ORDER — SODIUM CHLORIDE 0.9 % IV SOLN
INTRAVENOUS | Status: DC
  Administered 2012-02-13 – 2012-02-18 (×5): via INTRAVENOUS

## 2012-02-13 MED ORDER — FERROUS FUMARATE 325 (106 FE) MG PO TABS
1.0000 | ORAL_TABLET | Freq: Every day | ORAL | Status: DC
  Administered 2012-02-14 – 2012-02-18 (×5): 106 mg via ORAL
  Filled 2012-02-13 (×8): qty 1

## 2012-02-13 NOTE — Progress Notes (Signed)
Patient seen and examined.  Agree with PA's note. Will check WBC tomorrow.

## 2012-02-13 NOTE — Progress Notes (Signed)
Pt reports less bleeding. Previous note appreciated.  Labs reviewed.  A/P  Unscheduled uterine bleed, elevated prolactin--likely stress-related Bleeding tapering off on Provera

## 2012-02-13 NOTE — Progress Notes (Signed)
15 Days Post-Op  Subjective: Afebrile, VSS, creatinine is up which is also new, on Vancomycin, held yesterday for elevated level,  and being rechecked today. Still having nausea, with iron.  I/o= 1100/3350  Objective: Vital signs in last 24 hours: Temp:  [98.1 F (36.7 C)-98.4 F (36.9 C)] 98.1 F (36.7 C) (03/14 0527) Pulse Rate:  [64-74] 72  (03/14 0527) Resp:  [17-20] 20  (03/14 0527) BP: (117-150)/(67-76) 117/67 mmHg (03/14 0527) SpO2:  [97 %-98 %] 97 % (03/14 0527) Last BM Date: 02/12/12  Intake/Output from previous day: 03/13 0701 - 03/14 0700 In: 1100 [P.O.:400; IV Piggyback:700] Out: 3350 [Urine:3350] Intake/Output this shift: Total I/O In: 260 [P.O.:260] Out: 350 [Urine:350]  WU:[id number] up in chair and walking, Chest, clear  Abd:  Some pain, nausea with iron, Wound vac in place, PO's limited with nausea  Lab Results:   [facility] 02/12/12 0505 02/11/12 0421  WBC 11.9* 12.6*  HGB 6.5* 6.7*  HCT 20.7* 21.7*  PLT 544* 531*   13203} BMET  Basename 02/13/12 0511  NA --  K --  CL --  CO2 --  GLUCOSE --  BUN --  CREATININE 1.57*  CALCIUM --   PT/INR No results found for this basename: LABPROT:2,INR:2 in the last 72 hours   Studies/Results: Ct Abdomen Pelvis W Contrast  02/11/2012  *RADIOLOGY REPORT*  Clinical Data: Abdominal pain, fever  CT ABDOMEN AND PELVIS WITH CONTRAST  Technique:  Multidetector CT imaging of the abdomen and pelvis was performed following the standard protocol during bolus administration of intravenous contrast.  Contrast: OMNIPAQUE IOHEXOL 300 MG/ML IJ SOLN  Comparison: CT 02/06/2012, 01/29/2012  Findings: There is improvement in the atelectasis and pleural fluid at the left lung base.  There is residual mild air space disease. Cannot exclude infection.  No pericardial fluid.  No focal hepatic lesion.  Gallbladder, pancreas, spleen, and adrenal glands are normal.  Kidneys appear normal.  The stomach and small bowel appear normal.  No  small bowel obstruction.  The is surgical line at the cecal tip.  No evidence of organized abscess at the cecum.  There is mild pericecal cyst stranding moderate volume stool throughout the colon.  Rectum appears normal.  Abdominal aorta normal caliber.  No retroperitoneal lymphadenopathy.    There are several small fluid collection in the pelvis superior to the bladder which is similar to prior.  Fluid collections in the posterior cul-de-sac are decreased.  Fluid collection do  not appear to be a large enough to drain at this point.  Largest measures 2.6 x 2.0 cm (image 72 ) which is decreased from 3.5 x 2.3 cm on prior.  Midline ventral wound is noted.  Uterus and bladder grossly normal. No aggressive osseous lesions.  IMPRESSION:  1.Several small fluid collections/ abscess remain in the pelvis superior to the bladder.  These are not of drainable size.  2. Fluid collections in the posterior cul-de-sac and decreased in size. 3.  Improved atelectasis and effusion at the left lung base. Cannot exclude underlying air space disease.  Original Report Authenticated By: Genevive Bi, M.D.    Anti-infectives: Anti-infectives     Start     Dose/Rate Route Frequency Ordered Stop   02/12/12 1200   metroNIDAZOLE (FLAGYL) IVPB 500 mg        500 mg 100 mL/hr over 60 Minutes Intravenous Every 8 hours 02/12/12 1003     02/09/12 2000   vancomycin (VANCOCIN) IVPB 1000 mg/200 mL premix  Status:  Discontinued  1,000 mg 200 mL/hr over 60 Minutes Intravenous Every 8 hours 02/09/12 1705 02/12/12 1512   02/07/12 1800   vancomycin (VANCOCIN) IVPB 1000 mg/200 mL premix  Status:  Discontinued        1,000 mg 200 mL/hr over 60 Minutes Intravenous Every 8 hours 02/07/12 1715 02/09/12 1705   02/06/12 1400  piperacillin-tazobactam (ZOSYN) IVPB 3.375 g       3.375 g 12.5 mL/hr over 240 Minutes Intravenous 3 times per day 02/06/12 1312     02/06/12 1330   fluconazole (DIFLUCAN) tablet 400 mg        400 mg Oral  Daily 02/06/12 1234     02/06/12 0700   ertapenem (INVANZ) 1 g in sodium chloride 0.9 % 50 mL IVPB  Status:  Discontinued        1 g 100 mL/hr over 30 Minutes Intravenous Every 24 hours 02/06/12 0650 02/06/12 1234   01/30/12 2000   ertapenem (INVANZ) 1 g in sodium chloride 0.9 % 50 mL IVPB        1 g 100 mL/hr over 30 Minutes Intravenous Every 24 hours 01/30/12 0155 02/05/12 2125   01/30/12 0154   ertapenem (INVANZ) 1 g in sodium chloride 0.9 % 50 mL IVPB  Status:  Discontinued        1 g 100 mL/hr over 30 Minutes Intravenous Every 24 hours 01/30/12 0154 01/30/12 0207   01/29/12 2045   ertapenem (INVANZ) 1 g in sodium chloride 0.9 % 50 mL IVPB  Status:  Discontinued        1 g 100 mL/hr over 30 Minutes Intravenous On call to O.R. 01/29/12 2033 01/30/12 0209   01/29/12 1530  piperacillin-tazobactam (ZOSYN) IVPB 3.375 g       3.375 g 12.5 mL/hr over 240 Minutes Intravenous  Once 01/29/12 1526 01/29/12 1931         Current Facility-Administered Medications  Medication Dose Route Frequency Provider Last Rate Last Dose  . acetaminophen (TYLENOL) tablet 325-650 mg  325-650 mg Oral Q6H PRN Ardeth Sportsman, MD   650 mg at 02/07/12 0014  . alum & mag hydroxide-simeth (MAALOX/MYLANTA) 200-200-20 MG/5ML suspension 30 mL  30 mL Oral Q6H PRN Ardeth Sportsman, MD      . bisacodyl (DULCOLAX) suppository 10 mg  10 mg Rectal Q12H PRN Ardeth Sportsman, MD      . bisacodyl (DULCOLAX) suppository 10 mg  10 mg Rectal Daily Ardeth Sportsman, MD   10 mg at 02/08/12 1219  . diphenhydrAMINE (BENADRYL) injection 12.5-25 mg  12.5-25 mg Intravenous Q6H PRN Ardeth Sportsman, MD      . feeding supplement (ENSURE CLINICAL STRENGTH) liquid 237 mL  237 mL Oral BID BM Lavena Bullion, RD      . ferrous fumarate (HEMOCYTE - 106 mg FE) tablet 106 mg of iron  1 tablet Oral BID PC Sherrie George, PA   106 mg of iron at 02/12/12 0900  . Flora-Q (FLORA-Q) Capsule 1 capsule  1 capsule Oral Daily Ardeth Sportsman, MD   1  capsule at 02/12/12 1149  . fluconazole (DIFLUCAN) tablet 400 mg  400 mg Oral Daily Ardeth Sportsman, MD   400 mg at 02/12/12 1149  . magic mouthwash  15 mL Oral QID PRN Ardeth Sportsman, MD      . medroxyPROGESTERone (PROVERA) tablet 20 mg  20 mg Oral TID Antionette Char, MD   20 mg at 02/12/12 2107  . metroNIDAZOLE (FLAGYL) IVPB  500 mg  500 mg Intravenous Q8H Maryanna Shape Runyon, PHARMD   500 mg at 02/13/12 0502  . morphine 4 MG/ML injection 4 mg  4 mg Intravenous Q2H PRN Ardeth Sportsman, MD      . multivitamins with iron tablet 1 tablet  1 tablet Oral Daily Ardeth Sportsman, MD   1 tablet at 02/12/12 1150  . naproxen (NAPROSYN) tablet 500 mg  500 mg Oral BID WC Ardeth Sportsman, MD   500 mg at 02/12/12 1822  . ondansetron (ZOFRAN) tablet 4 mg  4 mg Oral Q6H PRN Caleen Essex III, MD       Or  . ondansetron [facility]) injection 4 mg  4 mg Intravenous Q6H PRN Caleen Essex III, MD   4 mg at 02/12/12 1822  . oxyCODONE (Oxy IR/ROXICODONE) immediate release tablet 5-15 mg  5-15 mg Oral Q4H PRN Freeman Caldron, PA   10 mg at 02/12/12 2219  . piperacillin-tazobactam (ZOSYN) IVPB 3.375 g  3.375 g Intravenous Q8H Randall K Absher, PHARMD   3.375 g at 02/13/12 0552  . promethazine (PHENERGAN) injection 12.5-25 mg  12.5-25 mg Intravenous Q6H PRN Ardeth Sportsman, MD   25 mg at 02/10/12 1540  . psyllium (HYDROCIL/METAMUCIL) packet 1 packet  1 packet Oral BID Ardeth Sportsman, MD   1 packet at 02/07/12 1214  . sodium chloride 0.9 % injection 3 mL  3 mL Intravenous Q12H Freeman Caldron, PA   3 mL at 02/12/12 2108  . DISCONTD: vancomycin (VANCOCIN) IVPB 1000 mg/200 mL premix  1,000 mg Intravenous Q8H Maryanna Shape Runyon, PHARMD   1,000 mg at 02/12/12 1327    Assessment/Plan perforated appendix with terminal ileum and left fallopian tube involvement 01/29/12  s/p Procedure(s) (LRB):  LAPAROSCOPY DIAGNOSTIC (N/A)  EXPLORATORY LAPAROTOMY (N/A)  UNILATERAL SALPINGECTOMY (Left)  APPLICATION OF WOUND VAC () 01/29/12 Dr.  Carolynne Edouard & Dr. Jean Rosenthal Christell Constant  Acute appendicitis w/perforation s/p open appy w/SB,colon resection  Left ovarian cyst s/p cystectomy, salpingectomy  ID -- WBC, temps up. Had been on Invanz x8d, now on Zosyn D#2, oral diflucan. Intraabdominal abscesses not amenable to drainage. Monitor progress. Last CT 02/06/12.  Left pleural effusion -- Asymptomatic  ABL anemia -- Stable  FEN -- Tolerating reg diet  VTE -- SCD's. Lovenox stopped because of anemia.  Anemia - Admit: 01/29/12 H/H= 9.5/28.8 -- 3/1 (8.3/26.5) 02/10/12 (6.8/22.3) Possible pnemonia; LLL infiltrate, and effusion vs pneumonia,  Unscheduled bleed in setting of paratubal abscess/ruptured appendix Seen by Dr. Tamela Oddi 02/12/12, and following. H/O menorrhagia  Severe anemia at present--normal TFTs  Leukocytosis trending down, pelvic abscesses smaller  Plan:  Recheck creatinie at lunch pharmacy is following vancomycin level, it is currently on hold. I stopped the naprosyn.  I am giving her fluid, 1l over 6 hours, and I will cut back on Iron.  LOS: 15 days    Sharna Gabrys 02/13/2012

## 2012-02-13 NOTE — Progress Notes (Signed)
ANTIBIOTIC CONSULT NOTE - Follow Up  Pharmacy Consult for Vancomycin Indication: rule out pneumonia  Allergies  Allergen Reactions  . Sulfa Antibiotics Hives    Patient Measurements: Height: 5\' 11"  (180.3 cm) Weight: 217 lb 6 oz (98.6 kg) IBW/kg (Calculated) : 70.8   Vital Signs: Temp: 98.1 F (36.7 C) (03/14 0527) Temp src: Oral (03/14 0527) BP: 117/67 mmHg (03/14 0527) Pulse Rate: 72  (03/14 0527)  Labs:  Basename 02/13/12 1220 02/13/12 0511 02/12/12 0505 02/11/12 0421  WBC -- -- 11.9* 12.6*  HGB -- -- 6.5* 6.7*  PLT -- -- 544* 531*  LABCREA -- -- -- --  CREATININE 1.55* 1.57* -- --   Estimated Creatinine Clearance: 71.7 ml/min (by C-G formula based on Cr of 1.55).  Basename 02/13/12 1220 02/12/12 1315  VANCOTROUGH 12.4 26.8*  VANCOPEAK -- --  Drue Dun -- --  GENTTROUGH -- --  GENTPEAK -- --  GENTRANDOM -- --  TOBRATROUGH -- --  TOBRAPEAK -- --  TOBRARND -- --  AMIKACINPEAK -- --  AMIKACINTROU -- --  AMIKACIN -- --     Microbiology: Recent Results (from the past 720 hour(s))  CULTURE, BLOOD (ROUTINE X 2)     Status: Normal   Collection Time   01/29/12  3:00 PM      Component Value Range Status Comment   Specimen Description BLOOD RIGHT ANTECUBITAL   Final    Special Requests NONE BOTTLES DRAWN AEROBIC AND ANAEROBIC [facility]   Final    Culture  Setup Time [id number]   Final    Culture NO GROWTH 5 DAYS   Final    Report Status 02/04/2012 FINAL   Final   ANAEROBIC CULTURE     Status: Normal   Collection Time   01/29/12  9:03 PM      Component Value Range Status Comment   Specimen Description PELVIS   Final    Special Requests NONE   Final    Gram Stain     Final    Value: ABUNDANT WBC PRESENT, PREDOMINANTLY PMN     NO SQUAMOUS EPITHELIAL CELLS SEEN     NO ORGANISMS SEEN   Culture     Final    Value: RARE BACTEROIDES FRAGILIS     Note: BETA LACTAMASE POSITIVE   Report Status 02/05/2012 FINAL   Final   CULTURE, ROUTINE-ABSCESS     Status:  Normal   Collection Time   01/29/12  9:03 PM      Component Value Range Status Comment   Specimen Description PELVIS   Final    Special Requests NONE   Final    Gram Stain     Final    Value: ABUNDANT WBC PRESENT, PREDOMINANTLY PMN     NO SQUAMOUS EPITHELIAL CELLS SEEN     NO ORGANISMS SEEN   Culture NO GROWTH 3 DAYS   Final    Report Status 02/02/2012 FINAL   Final   MRSA PCR SCREENING     Status: Normal   Collection Time   01/30/12  4:06 AM      Component Value Range Status Comment   MRSA by PCR NEGATIVE  NEGATIVE  Final     Medical History: Past Medical History  Diagnosis Date  . Anemia     Assessment:  26 y/o F s/p surgery 2/27 for perforated appendix with terminal ileum and fallopian tube involvement. Antibiotic therapy broadened 3/7-3/8 due to residual fluid collections in pelvis, leukocytosis, and LLL infiltrate.  Now on day #8 Zosyn / Fluconazole, day #7 Vancomycin, and day #2 IV Flagyl.  Afebrile, WBC improving, CT 3/12 showed reduction in size of fluid collections.  Vancomycin held after yesterday's 1pm dose due to high trough level.  Serum creatinine has risen since 3/11.  Vancomycin random level today shows rapid drop back into low therapeutic range.  Goal of Therapy:  Vancomycin trough level 15-20 mcg/ml  Plan:  1. Resume Vancomycin at a conservative dosage: 1250mg  IV q24h, next dose at 6pm 2. Await MD input on planned duration of therapy.   Elie Goody, PharmD, BCPS Pager: [phone] 02/13/2012  2:00 PM

## 2012-02-14 DIAGNOSIS — N289 Disorder of kidney and ureter, unspecified: Secondary | ICD-10-CM | POA: Diagnosis not present

## 2012-02-14 DIAGNOSIS — J90 Pleural effusion, not elsewhere classified: Secondary | ICD-10-CM | POA: Diagnosis not present

## 2012-02-14 DIAGNOSIS — N939 Abnormal uterine and vaginal bleeding, unspecified: Secondary | ICD-10-CM | POA: Diagnosis not present

## 2012-02-14 LAB — BASIC METABOLIC PANEL
BUN: 7 mg/dL (ref 6–23)
GFR calc non Af Amer: 42 mL/min — ABNORMAL LOW (ref >90)
Glucose, Bld: 116 mg/dL — ABNORMAL HIGH (ref 70–99)
Potassium: 3.6 mEq/L (ref 3.5–5.1)

## 2012-02-14 LAB — CBC
HCT: 21.2 % — ABNORMAL LOW (ref 36.0–46.0)
Hemoglobin: 6.6 g/dL — CL (ref 12.0–15.0)
MCH: 21.2 pg — ABNORMAL LOW (ref 26.0–34.0)
MCV: 68.2 fL — ABNORMAL LOW (ref 78.0–100.0)
RBC: 3.11 MIL/uL — ABNORMAL LOW (ref 3.87–5.11)

## 2012-02-14 NOTE — Progress Notes (Signed)
16 Days Post-Op  Subjective: Afebrile, VSS, Vancomycin on hold, creatinine up to 1.66, WBC is 10K; better  Objective: Vital signs in last 24 hours: Temp:  [97.9 F (36.6 C)-98.5 F (36.9 C)] 97.9 F (36.6 C) (03/15 0600) Pulse Rate:  [70] 70  (03/15 0600) Resp:  [18-19] 19  (03/15 0600) BP: (128-135)/(71-76) 128/71 mmHg (03/15 0600) SpO2:  [97 %-100 %] 97 % (03/15 0600) Last BM Date: 02/12/12  Intake/Output from previous day: 03/14 0701 - 03/15 0700 In: 583 [P.O.:480; I.V.:3; IV Piggyback:100] Out: 2500 [Urine:2500] Intake/Output this shift: Total I/O In: -  Out: 600 [Urine:600]  General appearance: alert, cooperative and no distress Resp: clear to auscultation bilaterally GI: soft, taking diet, +BS, BM, Vac in place  Lab Results:   [facility] 02/14/12 0455 02/12/12 0505  WBC 10.0 11.9*  HGB 6.6* 6.5*  HCT 21.2* 20.7*  PLT 584* 544*    BMET  Basename 02/14/12 0455 02/13/12 1220  NA 138 138  K 3.6 3.9  CL 103 101  CO2 27 29  GLUCOSE 116* 86  BUN 7 7  CREATININE 1.66* 1.55*  CALCIUM 9.1 8.9   PT/INR No results found for this basename: LABPROT:2,INR:2 in the last 72 hours   Studies/Results: No results found.  Anti-infectives: Anti-infectives     Start     Dose/Rate Route Frequency Ordered Stop   02/13/12 1800   vancomycin (VANCOCIN) 1,250 mg in sodium chloride 0.9 % 250 mL IVPB        1,250 mg 166.7 mL/hr over 90 Minutes Intravenous Every 24 hours 02/13/12 1402     02/12/12 1200   metroNIDAZOLE (FLAGYL) IVPB 500 mg        500 mg 100 mL/hr over 60 Minutes Intravenous Every 8 hours 02/12/12 1003     02/09/12 2000   vancomycin (VANCOCIN) IVPB 1000 mg/200 mL premix  Status:  Discontinued        1,000 mg 200 mL/hr over 60 Minutes Intravenous Every 8 hours 02/09/12 1705 02/12/12 1512   02/07/12 1800   vancomycin (VANCOCIN) IVPB 1000 mg/200 mL premix  Status:  Discontinued        1,000 mg 200 mL/hr over 60 Minutes Intravenous Every 8 hours 02/07/12 1715  02/09/12 1705   02/06/12 1400  piperacillin-tazobactam (ZOSYN) IVPB 3.375 g       3.375 g 12.5 mL/hr over 240 Minutes Intravenous 3 times per day 02/06/12 1312     02/06/12 1330   fluconazole (DIFLUCAN) tablet 400 mg        400 mg Oral Daily 02/06/12 1234     02/06/12 0700   ertapenem (INVANZ) 1 g in sodium chloride 0.9 % 50 mL IVPB  Status:  Discontinued        1 g 100 mL/hr over 30 Minutes Intravenous Every 24 hours 02/06/12 0650 02/06/12 1234   01/30/12 2000   ertapenem (INVANZ) 1 g in sodium chloride 0.9 % 50 mL IVPB        1 g 100 mL/hr over 30 Minutes Intravenous Every 24 hours 01/30/12 0155 02/05/12 2125   01/30/12 0154   ertapenem (INVANZ) 1 g in sodium chloride 0.9 % 50 mL IVPB  Status:  Discontinued        1 g 100 mL/hr over 30 Minutes Intravenous Every 24 hours 01/30/12 0154 01/30/12 0207   01/29/12 2045   ertapenem (INVANZ) 1 g in sodium chloride 0.9 % 50 mL IVPB  Status:  Discontinued  1 g 100 mL/hr over 30 Minutes Intravenous On call to O.R. 01/29/12 2033 01/30/12 0209   01/29/12 1530  piperacillin-tazobactam (ZOSYN) IVPB 3.375 g       3.375 g 12.5 mL/hr over 240 Minutes Intravenous  Once 01/29/12 1526 01/29/12 1931         Current Facility-Administered Medications  Medication Dose Route Frequency Provider Last Rate Last Dose  . 0.9 %  sodium chloride infusion   Intravenous Continuous Sherrie George, PA 100 mL/hr at 02/13/12 2033    . acetaminophen (TYLENOL) tablet 325-650 mg  325-650 mg Oral Q6H PRN Ardeth Sportsman, MD   650 mg at 02/07/12 0014  . alum & mag hydroxide-simeth (MAALOX/MYLANTA) 200-200-20 MG/5ML suspension 30 mL  30 mL Oral Q6H PRN Ardeth Sportsman, MD      . bisacodyl (DULCOLAX) suppository 10 mg  10 mg Rectal Q12H PRN Ardeth Sportsman, MD      . bisacodyl (DULCOLAX) suppository 10 mg  10 mg Rectal Daily Ardeth Sportsman, MD   10 mg at 02/08/12 1219  . diphenhydrAMINE (BENADRYL) injection 12.5-25 mg  12.5-25 mg Intravenous Q6H PRN Ardeth Sportsman, MD      . feeding supplement (ENSURE CLINICAL STRENGTH) liquid 237 mL  237 mL Oral BID BM Lavena Bullion, RD   237 mL at 02/13/12 1459  . ferrous fumarate (HEMOCYTE - 106 mg FE) tablet 106 mg of iron  1 tablet Oral QHS Sherrie George, [place]      . Flora-Q (FLORA-Q) Capsule 1 capsule  1 capsule Oral Daily Ardeth Sportsman, MD   1 capsule at 02/13/12 [phone]  . fluconazole (DIFLUCAN) tablet 400 mg  400 mg Oral Daily Ardeth Sportsman, MD   400 mg at 02/13/12 0912  . magic mouthwash  15 mL Oral QID PRN Ardeth Sportsman, MD      . medroxyPROGESTERone (PROVERA) tablet 20 mg  20 mg Oral TID Antionette Char, MD   20 mg at 02/13/12 2301  . metroNIDAZOLE (FLAGYL) IVPB 500 mg  500 mg Intravenous Q8H Maryanna Shape [place], PHARMD   500 mg at 02/14/12 0412  . morphine 4 MG/ML injection 4 mg  4 mg Intravenous Q2H PRN Ardeth Sportsman, MD      . multivitamins with iron tablet 1 tablet  1 tablet Oral Daily Ardeth Sportsman, MD   1 tablet at 02/13/12 0912  . ondansetron (ZOFRAN) tablet 4 mg  4 mg Oral Q6H PRN Caleen Essex III, MD   4 mg at 02/13/12 1039   Or  . ondansetron (ZOFRAN) injection 4 mg  4 mg Intravenous Q6H PRN Caleen Essex III, MD   4 mg at 02/12/12 1822  . oxyCODONE (Oxy IR/ROXICODONE) immediate release tablet 5-15 mg  5-15 mg Oral Q4H PRN Freeman Caldron, PA   10 mg at 02/14/12 0410  . piperacillin-tazobactam (ZOSYN) IVPB 3.375 g  3.375 g Intravenous Q8H Randall K Absher, PHARMD   3.375 g at 02/14/12 0630  . promethazine (PHENERGAN) injection 12.5-25 mg  12.5-25 mg Intravenous Q6H PRN Ardeth Sportsman, MD   25 mg at 02/10/12 1540  . psyllium (HYDROCIL/METAMUCIL) packet 1 packet  1 packet Oral BID Ardeth Sportsman, MD   1 packet at 02/07/12 1214  . sodium chloride 0.9 % bolus 1,000 mL  1,000 mL Intravenous Once Sherrie George, PA   1,000 mL at 02/13/12 1028  . sodium chloride 0.9 % injection 3 mL  3 mL Intravenous  Q12H Freeman Caldron, PA   3 mL at 02/13/12 2032  . vancomycin (VANCOCIN) 1,250 mg in  sodium chloride 0.9 % 250 mL IVPB  1,250 mg Intravenous Q24H Randall K Absher, PHARMD   1,250 mg at 02/13/12 1836    Assessment/Plan perforated appendix with terminal ileum and left fallopian tube involvement 01/29/12  s/p Procedure(s) (LRB):  LAPAROSCOPY DIAGNOSTIC (N/A)  EXPLORATORY LAPAROTOMY (N/A)  UNILATERAL SALPINGECTOMY (Left)  APPLICATION OF WOUND VAC () 01/29/12 Dr. Carolynne Edouard & Dr. Jean Rosenthal Christell Constant  Acute appendicitis w/perforation s/p open appy w/SB,colon resection  Left ovarian cyst s/p cystectomy, salpingectomy  ID -- WBC, temps up. Had been on Invanz x8d, now on Zosyn D#2, oral diflucan. Intraabdominal abscesses not amenable to drainage. Monitor progress. Last CT 02/06/12.  Left pleural effusion -- Asymptomatic  ABL anemia -- Stable  FEN -- Tolerating reg diet  VTE -- SCD's. Lovenox stopped because of anemia.  Anemia - Admit: 01/29/12 H/H= 9.5/28.8 -- 3/1 (8.3/26.5) 02/10/12 (6.8/22.3) Possible pnemonia; LLL infiltrate, and effusion vs pneumonia,  Unscheduled bleed in setting of paratubal abscess/ruptured appendix Seen by Dr. Tamela Oddi 02/12/12, and following.  H/O menorrhagia  Severe anemia at present--normal TFTs  Leukocytosis trending down, pelvic abscesses smaller Vaginal bleeding better on provera  acute renal insuffiency on Vancomycin   Plan;  Holding vancomycin, naprosyn, continue other antibiotics, continue to hydrate. We will D/C vancomycin while creatinine is up.    LOS: 16 days    [name] [name] 02/14/2012

## 2012-02-14 NOTE — Progress Notes (Signed)
Patient seen and examined.  She has some Vancomycin nephrotoxicity and so Vanc has been stopped.  She also has significant anemia, but would like to avoid a transfusion.  We discussed the risks and benefits of a transfusion and she make need it if she becomes symptomatic.  Will continue current antibiotic regimen as WBC has normalized.  Will monitor creatinine.  Wound looks good and VAC was reapplied.

## 2012-02-14 NOTE — Plan of Care (Signed)
Problem: Food- and Nutrition-Related Knowledge Deficit (NB-1.1) Goal: Nutrition education Formal process to instruct or train a patient/client in a skill or to impart knowledge to help patients/clients voluntarily manage or modify food choices and eating behavior to maintain or improve health.  Outcome: Completed/Met Date Met:  02/14/12 PA requested pt get information on iron rich foods. Provided pt with a list of these foods and a handout about sources of iron in the diet which were reviewed with pt. Encouraged pt to select at least 1-2 iron rich foods per meal. Pt expressed understanding.

## 2012-02-14 NOTE — Progress Notes (Signed)
Pt tolerating regular diet at this time. Only with a complaint of nausea after taking" iron pill" will f/u with dayshift RN for possible premedication prior to giving dose. Wound vac intact, bloody and serous drainage noted. Other incisional sites Are scabbed over and well approximated. IV fluids infusing without difficulty. Pt with family visitors early and father to  Stay overnight at the bedside. Pain under control at this time. SCD's bilaterally  intact. Will continue to monitor pt per doctor ordered protocol.

## 2012-02-14 NOTE — Progress Notes (Signed)
ANTIBIOTIC CONSULT NOTE - Follow Up  Pharmacy Consult for Vancomycin Indication: rule out pneumonia  Allergies  Allergen Reactions  . Sulfa Antibiotics Hives    Patient Measurements: Height: 5\' 11"  (180.3 cm) Weight: 217 lb 6 oz (98.6 kg) IBW/kg (Calculated) : 70.8   Vital Signs: Temp: 97.9 F (36.6 C) (03/15 0600) Temp src: Oral (03/15 0600) BP: 128/71 mmHg (03/15 0600) Pulse Rate: 70  (03/15 0600)  Labs:  Basename 02/14/12 0455 02/13/12 1220 02/13/12 0511 02/12/12 0505  WBC 10.0 -- -- 11.9*  HGB 6.6* -- -- 6.5*  PLT 584* -- -- 544*  LABCREA -- -- -- --  CREATININE 1.66* 1.55* 1.57* --   Estimated Creatinine Clearance: 67 ml/min (by C-G formula based on Cr of 1.66).  Basename 02/13/12 1220 02/12/12 1315  VANCOTROUGH 12.4 26.8*  VANCOPEAK -- --  Drue Dun -- --  GENTTROUGH -- --  GENTPEAK -- --  GENTRANDOM -- --  TOBRATROUGH -- --  TOBRAPEAK -- --  TOBRARND -- --  AMIKACINPEAK -- --  AMIKACINTROU -- --  AMIKACIN -- --     Microbiology: Recent Results (from the past 720 hour(s))  CULTURE, BLOOD (ROUTINE X 2)     Status: Normal   Collection Time   01/29/12  3:00 PM      Component Value Range Status Comment   Specimen Description BLOOD RIGHT ANTECUBITAL   Final    Special Requests NONE BOTTLES DRAWN AEROBIC AND ANAEROBIC [facility]   Final    Culture  Setup Time [id number]   Final    Culture NO GROWTH 5 DAYS   Final    Report Status 02/04/2012 FINAL   Final   ANAEROBIC CULTURE     Status: Normal   Collection Time   01/29/12  9:03 PM      Component Value Range Status Comment   Specimen Description PELVIS   Final    Special Requests NONE   Final    Gram Stain     Final    Value: ABUNDANT WBC PRESENT, PREDOMINANTLY PMN     NO SQUAMOUS EPITHELIAL CELLS SEEN     NO ORGANISMS SEEN   Culture     Final    Value: RARE BACTEROIDES FRAGILIS     Note: BETA LACTAMASE POSITIVE   Report Status 02/05/2012 FINAL   Final   CULTURE, ROUTINE-ABSCESS     Status:  Normal   Collection Time   01/29/12  9:03 PM      Component Value Range Status Comment   Specimen Description PELVIS   Final    Special Requests NONE   Final    Gram Stain     Final    Value: ABUNDANT WBC PRESENT, PREDOMINANTLY PMN     NO SQUAMOUS EPITHELIAL CELLS SEEN     NO ORGANISMS SEEN   Culture NO GROWTH 3 DAYS   Final    Report Status 02/02/2012 FINAL   Final   MRSA PCR SCREENING     Status: Normal   Collection Time   01/30/12  4:06 AM      Component Value Range Status Comment   MRSA by PCR NEGATIVE  NEGATIVE  Final     Medical History: Past Medical History  Diagnosis Date  . Anemia     Assessment:  26 y/o F s/p surgery 2/27 for perforated appendix with terminal ileum and fallopian tube involvement. Antibiotic therapy broadened 3/7-3/8 due to residual fluid collections in pelvis, leukocytosis, and LLL infiltrate.  Now on day #9 Zosyn / Fluconazole, day #8 Vancomycin, and day #3 IV Flagyl.  Afebrile, WBC improving, CT 3/12 showed reduction in size of fluid collections.  Vancomycin held after 3/13 1pm dose due to high trough level.  Serum creatinine has risen steadily since 3/11.  Vancomycin random level 3/15 shows rapid drop back into low therapeutic range.  Low dose vancomycin resumed 3/14  Goal of Therapy:  Vancomycin trough level 15-20 mcg/ml  Plan:  1. Continue vancomycin at a conservative dosage for now 2. Await MD input on planned duration of therapy.   Loralee Pacas, PharmD, BCPS Pager: [phone] 02/14/2012  10:44 AM

## 2012-02-14 NOTE — Progress Notes (Signed)
Pt reports no per vaginal bleeding.  A/P  Unscheduled bleed w/response to Provera   Continue Provera 20 mg TID to complete 7 day course--> Provera 20 mg daily for at least 3 weeks  Counseled to f/u w/GYN after discharge--candidate for induced amenorrhea x several months in light of anemia  Will re-consult as needed.  [id number]

## 2012-02-14 NOTE — Progress Notes (Signed)
Dr. Abbey Chatters in to round on patient. MD removed wound vac dressing to assess wound. Sterile wound vac kit opened. Wound area measured with sterile ruler at 9x5 1/2cm. Black sponge cut and placed. Wound vac reapplied. Patient tolerated well. Father at bedside attentive and supportive.

## 2012-02-15 LAB — BASIC METABOLIC PANEL
BUN: 8 mg/dL (ref 6–23)
CO2: 24 mEq/L (ref 19–32)
Calcium: 9 mg/dL (ref 8.4–10.5)
Creatinine, Ser: 1.65 mg/dL — ABNORMAL HIGH (ref 0.50–1.10)
Glucose, Bld: 95 mg/dL (ref 70–99)

## 2012-02-15 NOTE — Progress Notes (Addendum)
17 Days Post-Op  Subjective: The patient is feeling fairly well. Not much pain. Tolerating diet. Ambulating in the hall. Urine output high.  Afebrile. Stable vital signs.  Creatinine 1.65, stable, BUN 8, potassium 4.2, CBC not done.  Objective: Vital signs in last 24 hours: Temp:  [98 F (36.7 C)-99.7 F (37.6 C)] 98 F (36.7 C) (03/16 0615) Pulse Rate:  [76-78] 76  (03/16 0615) Resp:  [18] 18  (03/16 0615) BP: (127-129)/(63-79) 129/69 mmHg (03/16 0615) SpO2:  [97 %-99 %] 99 % (03/16 0615) Last BM Date: 02/14/12  Intake/Output from previous day: 03/15 0701 - 03/16 0700 In: 1576 [P.O.:600; I.V.:976] Out: 5475 [Urine:5350; Emesis/NG output:125] Intake/Output this shift:    General appearance: alert. No distress. GI: abdomen soft. Minimal tenderness. Not distended. No mass. VAC in place.  Lab Results:  Results for orders placed during the hospital encounter of 01/29/12 (from the past 24 hour(s))  BASIC METABOLIC PANEL     Status: Abnormal   Collection Time   02/15/12  4:31 AM      Component Value Range   Sodium 137  135 - 145 (mEq/L)   Potassium 4.2  3.5 - 5.1 (mEq/L)   Chloride 104  96 - 112 (mEq/L)   CO2 24  19 - 32 (mEq/L)   Glucose, Bld 95  70 - 99 (mg/dL)   BUN 8  6 - 23 (mg/dL)   Creatinine, Ser 9.60 (*) 0.50 - 1.10 (mg/dL)   Calcium 9.0  8.4 - 45.4 (mg/dL)   GFR calc non Af Amer 42 (*) >90 (mL/min)   GFR calc Af Amer 49 (*) >90 (mL/min)     Studies/Results: @RISRSLT24 @     . bisacodyl  10 mg Rectal Daily  . feeding supplement  237 mL Oral BID BM  . ferrous fumarate  1 tablet Oral QHS  . Flora-Q  1 capsule Oral Daily  . fluconazole  400 mg Oral Daily  . medroxyPROGESTERone  20 mg Oral TID  . metronidazole  500 mg Intravenous Q8H  . multivitamins with iron  1 tablet Oral Daily  . piperacillin-tazobactam (ZOSYN)  IV  3.375 g Intravenous Q8H  . psyllium  1 packet Oral BID  . sodium chloride  3 mL Intravenous Q12H  . DISCONTD: vancomycin  1,250 mg  Intravenous Q24H     Assessment/Plan: s/p Procedure(s): LAPAROSCOPY DIAGNOSTIC EXPLORATORY LAPAROTOMY UNILATERAL SALPINGECTOMY APPLICATION OF WOUND VAC  Perforated appendicitis with involvement of terminal ileum and fallopian tube, status post ileocecectomy and salpingectomy.  Post pelvic abscess , not amenable to drainage. Responding to antibiotics.  Postop anemia, tolerating reasonably well, on iron  Vancomycin toxicity, non-oliguric renal insufficiency, stable, not progressive, continued to monitor  Current plan is to continue antibiotic regimen over the weekend and repeat CT scan Monday. If her pelvic fluid collections are responding to antibiotics, consider discharge home on 2 weeks of oral antibiotics.    LOS: 17 days    Thanos Cousineau M 02/15/2012  . .prob

## 2012-02-15 NOTE — Progress Notes (Signed)
Assessed pt. Difficult stick for IV's.  Pt requests to wait on restart since current IV is working well and plan for d/c home Monday AM.  Current IV CDI without signs of infection or leaking.  Will notify staff RN of keeping IV in per pt request and to notify if new IV needed.

## 2012-02-16 LAB — CBC
MCH: 20.9 pg — ABNORMAL LOW (ref 26.0–34.0)
MCHC: 31 g/dL (ref 30.0–36.0)
MCV: 67.5 fL — ABNORMAL LOW (ref 78.0–100.0)
Platelets: 581 10*3/uL — ABNORMAL HIGH (ref 150–400)

## 2012-02-16 LAB — BASIC METABOLIC PANEL
BUN: 7 mg/dL (ref 6–23)
Calcium: 9.2 mg/dL (ref 8.4–10.5)
GFR calc non Af Amer: 45 mL/min — ABNORMAL LOW (ref >90)
Glucose, Bld: 95 mg/dL (ref 70–99)

## 2012-02-16 LAB — PREGNANCY, URINE: Preg Test, Ur: NEGATIVE

## 2012-02-16 NOTE — Progress Notes (Signed)
CRITICAL VALUE ALERT  Critical value received:  hgb 6.6  Date of notification:  02/16/12  Time of notification:  0638  Critical value read back:yes  Nurse who received alert:  Pernell Dupre, RN  MD notified (1st page):  Dr Biagio Quint  Time of first page:  0700  MD notified (2nd page):  Time of second page:  Responding MD: Dr Felipa Evener Biagio Quint Time MD responded:  [phone]

## 2012-02-16 NOTE — Progress Notes (Signed)
18 Days Post-Op  Subjective: Stable and alert. She had some nausea yesterday but this has resolved. She is having bowel movements. She is ambulating in the hall. Good urine output.  Afebrile.BUN and creatinine are stable.  Objective: Vital signs in last 24 hours: Temp:  [98.1 F (36.7 C)-99.3 F (37.4 C)] 98.1 F (36.7 C) (03/17 0452) Pulse Rate:  [73-76] 73  (03/17 0452) Resp:  [18] 18  (03/17 0452) BP: (109-136)/(57-71) 127/57 mmHg (03/17 0452) SpO2:  [98 %-99 %] 98 % (03/17 0452) Last BM Date: 02/15/12  Intake/Output from previous day: 03/16 0701 - 03/17 0700 In: 5545 [P.O.:600; I.V.:3695; IV Piggyback:1250] Out: 3500 [Urine:3500] Intake/Output this shift:    General appearance: alert. Cooperative. Comfortable appearing. Nontoxic. GI: soft. Essentially nontender. VAC in place.  Lab Results:  Results for orders placed during the hospital encounter of 01/29/12 (from the past 24 hour(s))  PREGNANCY, URINE     Status: Normal   Collection Time   02/16/12  3:24 AM      Component Value Range   Preg Test, Ur NEGATIVE  NEGATIVE   BASIC METABOLIC PANEL     Status: Abnormal   Collection Time   02/16/12  5:48 AM      Component Value Range   Sodium 136  135 - 145 (mEq/L)   Potassium 4.0  3.5 - 5.1 (mEq/L)   Chloride 102  96 - 112 (mEq/L)   CO2 24  19 - 32 (mEq/L)   Glucose, Bld 95  70 - 99 (mg/dL)   BUN 7  6 - 23 (mg/dL)   Creatinine, Ser 9.60 (*) 0.50 - 1.10 (mg/dL)   Calcium 9.2  8.4 - 45.4 (mg/dL)   GFR calc non Af Amer 45 (*) >90 (mL/min)   GFR calc Af Amer 52 (*) >90 (mL/min)  CBC     Status: Abnormal   Collection Time   02/16/12  5:48 AM      Component Value Range   WBC 9.8  4.0 - 10.5 (K/uL)   RBC 3.11 (*) 3.87 - 5.11 (MIL/uL)   Hemoglobin 6.6 (*) 12.0 - 15.0 (g/dL)   HCT 09.8 (*) 11.9 - 46.0 (%)   MCV 67.5 (*) 78.0 - 100.0 (fL)   MCH 20.9 (*) 26.0 - 34.0 (pg)   MCHC 31.0  30.0 - 36.0 (g/dL)   RDW 14.7 (*) 82.9 - 15.5 (%)   Platelets 581 (*) 150 - 400 (K/uL)      Studies/Results: @RISRSLT24 @     . bisacodyl  10 mg Rectal Daily  . feeding supplement  237 mL Oral BID BM  . ferrous fumarate  1 tablet Oral QHS  . Flora-Q  1 capsule Oral Daily  . fluconazole  400 mg Oral Daily  . medroxyPROGESTERone  20 mg Oral TID  . metronidazole  500 mg Intravenous Q8H  . multivitamins with iron  1 tablet Oral Daily  . piperacillin-tazobactam (ZOSYN)  IV  3.375 g Intravenous Q8H  . psyllium  1 packet Oral BID  . sodium chloride  3 mL Intravenous Q12H     Assessment/Plan: s/p Procedure(s): LAPAROSCOPY DIAGNOSTIC EXPLORATORY LAPAROTOMY UNILATERAL SALPINGECTOMY APPLICATION OF WOUND VAC  Perforated appendicitis with involvement of terminal ileum and fallopian tube, status post ileocecectomy and salpingectomy.  Post pelvic abscess , not amenable to drainage. Responding to antibiotics.   Postop anemia, tolerating reasonably well, on iron   Vancomycin toxicity, non-oliguric renal insufficiency, stable, not progressive, continued to monitor   Current plan is to continue antibiotic  regimen over the weekend and repeat CT scan Monday. If her pelvic fluid collections are responding to antibiotics, consider discharge home on 2 weeks of oral antibiotics.      LOS: 18 days    Ezinne Yogi M. Derrell Lolling, M.D., [facility] General and Minimally invasive Surgery Breast and Colorectal Surgery Office:   [phone] Pager:   [phone]  02/16/2012  . .prob

## 2012-02-17 ENCOUNTER — Encounter (HOSPITAL_COMMUNITY): Payer: Self-pay | Admitting: Radiology

## 2012-02-17 ENCOUNTER — Inpatient Hospital Stay (HOSPITAL_COMMUNITY): Payer: Managed Care, Other (non HMO)

## 2012-02-17 DIAGNOSIS — N289 Disorder of kidney and ureter, unspecified: Secondary | ICD-10-CM

## 2012-02-17 LAB — BASIC METABOLIC PANEL
CO2: 24 mEq/L (ref 19–32)
Calcium: 9.2 mg/dL (ref 8.4–10.5)
GFR calc Af Amer: 51 mL/min — ABNORMAL LOW (ref >90)
GFR calc non Af Amer: 44 mL/min — ABNORMAL LOW (ref >90)
Sodium: 136 mEq/L (ref 135–145)

## 2012-02-17 LAB — CLOSTRIDIUM DIFFICILE BY PCR: Toxigenic C. Difficile by PCR: NEGATIVE

## 2012-02-17 MED ORDER — IOHEXOL 300 MG/ML  SOLN
100.0000 mL | Freq: Once | INTRAMUSCULAR | Status: AC | PRN
  Administered 2012-02-17: 100 mL via INTRAVENOUS

## 2012-02-17 NOTE — Progress Notes (Signed)
ANTIBIOTIC CONSULT NOTE - FOLLOW UP  Pharmacy Consult for Zosyn Indication: Post Pelvic Abscess s/p surg for perf'd appendix 2/27   Allergies  Allergen Reactions  . Sulfa Antibiotics Hives    Patient Measurements: Height: 5\' 11"  (180.3 cm) Weight: 217 lb 6 oz (98.6 kg) IBW/kg (Calculated) : 70.8   Vital Signs: Temp: 98.8 F (37.1 C) (03/18 0500) Temp src: Oral (03/18 0500) BP: 116/58 mmHg (03/18 0500) Pulse Rate: 64  (03/18 0500) Intake/Output from previous day: 03/17 0701 - 03/18 0700 In: 1099.2 [P.O.:480; I.V.:519.2; IV Piggyback:100] Out: 4200 [Urine:4200] Intake/Output from this shift: Total I/O In: -  Out: 650 [Urine:650]  Labs:  [facility] 02/17/12 0425 02/16/12 0548 02/15/12 0431  WBC -- 9.8 --  HGB -- 6.6* --  PLT -- 581* --  LABCREA -- -- --  CREATININE 1.61* 1.58* 1.65*   Estimated Creatinine Clearance: 69.1 ml/min (by C-G formula based on Cr of 1.61). No results found for this basename: VANCOTROUGH:2,VANCOPEAK:2,VANCORANDOM:2,GENTTROUGH:2,GENTPEAK:2,GENTRANDOM:2,TOBRATROUGH:2,TOBRAPEAK:2,TOBRARND:2,AMIKACINPEAK:2,AMIKACINTROU:2,AMIKACIN:2, in the last 72 hours   Microbiology: Recent Results (from the past 720 hour(s))  CULTURE, BLOOD (ROUTINE X 2)     Status: Normal   Collection Time   01/29/12  3:00 PM      Component Value Range Status Comment   Specimen Description BLOOD RIGHT ANTECUBITAL   Final    Special Requests NONE BOTTLES DRAWN AEROBIC AND ANAEROBIC [facility]   Final    Culture  Setup Time [id number]   Final    Culture NO GROWTH 5 DAYS   Final    Report Status 02/04/2012 FINAL   Final   ANAEROBIC CULTURE     Status: Normal   Collection Time   01/29/12  9:03 PM      Component Value Range Status Comment   Specimen Description PELVIS   Final    Special Requests NONE   Final    Gram Stain     Final    Value: ABUNDANT WBC PRESENT, PREDOMINANTLY PMN     NO SQUAMOUS EPITHELIAL CELLS SEEN     NO ORGANISMS SEEN   Culture     Final    Value:  RARE BACTEROIDES FRAGILIS     Note: BETA LACTAMASE POSITIVE   Report Status 02/05/2012 FINAL   Final   CULTURE, ROUTINE-ABSCESS     Status: Normal   Collection Time   01/29/12  9:03 PM      Component Value Range Status Comment   Specimen Description PELVIS   Final    Special Requests NONE   Final    Gram Stain     Final    Value: ABUNDANT WBC PRESENT, PREDOMINANTLY PMN     NO SQUAMOUS EPITHELIAL CELLS SEEN     NO ORGANISMS SEEN   Culture NO GROWTH 3 DAYS   Final    Report Status 02/02/2012 FINAL   Final   MRSA PCR SCREENING     Status: Normal   Collection Time   01/30/12  4:06 AM      Component Value Range Status Comment   MRSA by PCR NEGATIVE  NEGATIVE  Final     Anti-infectives     Start     Dose/Rate Route Frequency Ordered Stop   02/13/12 1800   vancomycin (VANCOCIN) 1,250 mg in sodium chloride 0.9 % 250 mL IVPB  Status:  Discontinued        1,250 mg 166.7 mL/hr over 90 Minutes Intravenous Every 24 hours 02/13/12 1402 02/14/12 1215   02/12/12 1200   metroNIDAZOLE (  FLAGYL) IVPB 500 mg        500 mg 100 mL/hr over 60 Minutes Intravenous Every 8 hours 02/12/12 1003     02/09/12 2000   vancomycin (VANCOCIN) IVPB 1000 mg/200 mL premix  Status:  Discontinued        1,000 mg 200 mL/hr over 60 Minutes Intravenous Every 8 hours 02/09/12 1705 02/12/12 1512   02/07/12 1800   vancomycin (VANCOCIN) IVPB 1000 mg/200 mL premix  Status:  Discontinued        1,000 mg 200 mL/hr over 60 Minutes Intravenous Every 8 hours 02/07/12 1715 02/09/12 1705   02/06/12 1400  piperacillin-tazobactam (ZOSYN) IVPB 3.375 g       3.375 g 12.5 mL/hr over 240 Minutes Intravenous 3 times per day 02/06/12 1312     02/06/12 1330   fluconazole (DIFLUCAN) tablet 400 mg        400 mg Oral Daily 02/06/12 1234     02/06/12 0700   ertapenem (INVANZ) 1 g in sodium chloride 0.9 % 50 mL IVPB  Status:  Discontinued        1 g 100 mL/hr over 30 Minutes Intravenous Every 24 hours 02/06/12 0650 02/06/12 1234    01/30/12 2000   ertapenem (INVANZ) 1 g in sodium chloride 0.9 % 50 mL IVPB        1 g 100 mL/hr over 30 Minutes Intravenous Every 24 hours 01/30/12 0155 02/05/12 2125   01/30/12 0154   ertapenem (INVANZ) 1 g in sodium chloride 0.9 % 50 mL IVPB  Status:  Discontinued        1 g 100 mL/hr over 30 Minutes Intravenous Every 24 hours 01/30/12 0154 01/30/12 0207   01/29/12 2045   ertapenem (INVANZ) 1 g in sodium chloride 0.9 % 50 mL IVPB  Status:  Discontinued        1 g 100 mL/hr over 30 Minutes Intravenous On call to O.R. 01/29/12 2033 01/30/12 0209   01/29/12 1530  piperacillin-tazobactam (ZOSYN) IVPB 3.375 g       3.375 g 12.5 mL/hr over 240 Minutes Intravenous  Once 01/29/12 1526 01/29/12 1931          Assessment:  26 y/o F s/p surgery 2/27 for perforated appendix with terminal ileum and fallopian tube involvement. Antibiotic therapy broadened 3/7-3/8 due to residual fluid collections in pelvis, leukocytosis, and LLL infiltrate.  Day #12 Zosyn / Fluconazole, Day #6 IV Flagyl.    CT Scan results from today pending.  SCr holding / stable (1.63)  Afebrile, WBC trending down (9.8 on 3/17)  Patient beginning to have loose stools, C diff to be collected   Plan:   Per MD, if her pelvic fluid collections are responding to antibiotics, consider discharge home on 2 weeks of oral antibiotics.  Await results of today's CT Scan  Continue monitoring renal function for Abx therapy.    Tamala Bari, PharmD Candidate 02/17/2012,1:15 PM

## 2012-02-17 NOTE — Progress Notes (Signed)
Agree with Pharmacy student assessment/plan. Zosyn dose remains appropraite for level of renal function. Await planned duration of Zosyn. F/U C.diff Cx results.  Darrol Angel, PharmD Pager: [phone] 02/17/2012 2:45 PM

## 2012-02-17 NOTE — Progress Notes (Signed)
19 Days Post-Op  Subjective: Tm99.3, VSS, +bm, creatinine still at 1.61, last Wbc yesterday, with normal WBC 9.8. Still having some nausea, and loose stools now. Diflucan, flagyl, zosyn are her current antibiotics, Vancomycin held last week, also on MVI and FE daily for anemia. Objective: Vital signs in last 24 hours: Temp:  [97.7 F (36.5 C)-99.3 F (37.4 C)] 98.8 F (37.1 C) (03/18 0500) Pulse Rate:  [64-77] 64  (03/18 0500) Resp:  [18-20] 18  (03/18 0500) BP: (116-130)/(58-73) 116/58 mmHg (03/18 0500) SpO2:  [100 %] 100 % (03/18 0500) Last BM Date: 02/16/12  Intake/Output from previous day: 03/17 0701 - 03/18 0700 In: 1099.2 [P.O.:480; I.V.:519.2; IV Piggyback:100] Out: 4200 [Urine:4200] Intake/Output this shift:    General appearance: alert, cooperative, no distress and walking halls without difficulty Resp: clear to auscultation bilaterally GI: soft, non-tender; bowel sounds normal; no masses,  no organomegaly and Wound vac in place.  Lab Results:   [facility] 02/16/12 0548  WBC 9.8  HGB 6.6*  HCT 21.0*  PLT 581*    BMET  Basename 02/17/12 0425 02/16/12 0548  NA 136 136  K 4.1 4.0  CL 103 102  CO2 24 24  GLUCOSE 89 95  BUN 8 7  CREATININE 1.61* 1.58*  CALCIUM 9.2 9.2    No results found for this basename: AST:5,ALT:5,ALKPHOS:5,BILITOT:5,PROT:5,ALBUMIN:5 in the last 168 hours  PT/INR No results found for this basename: LABPROT:2,INR:2 in the last 72 hours   Studies/Results: No results found.  Anti-infectives: Anti-infectives     Start     Dose/Rate Route Frequency Ordered Stop   02/13/12 1800   vancomycin (VANCOCIN) 1,250 mg in sodium chloride 0.9 % 250 mL IVPB  Status:  Discontinued        1,250 mg 166.7 mL/hr over 90 Minutes Intravenous Every 24 hours 02/13/12 1402 02/14/12 1215   02/12/12 1200   metroNIDAZOLE (FLAGYL) IVPB 500 mg        500 mg 100 mL/hr over 60 Minutes Intravenous Every 8 hours 02/12/12 1003     02/09/12 2000   vancomycin  (VANCOCIN) IVPB 1000 mg/200 mL premix  Status:  Discontinued        1,000 mg 200 mL/hr over 60 Minutes Intravenous Every 8 hours 02/09/12 1705 02/12/12 1512   02/07/12 1800   vancomycin (VANCOCIN) IVPB 1000 mg/200 mL premix  Status:  Discontinued        1,000 mg 200 mL/hr over 60 Minutes Intravenous Every 8 hours 02/07/12 1715 02/09/12 1705   02/06/12 1400  piperacillin-tazobactam (ZOSYN) IVPB 3.375 g       3.375 g 12.5 mL/hr over 240 Minutes Intravenous 3 times per day 02/06/12 1312     02/06/12 1330   fluconazole (DIFLUCAN) tablet 400 mg        400 mg Oral Daily 02/06/12 1234     02/06/12 0700   ertapenem (INVANZ) 1 g in sodium chloride 0.9 % 50 mL IVPB  Status:  Discontinued        1 g 100 mL/hr over 30 Minutes Intravenous Every 24 hours 02/06/12 0650 02/06/12 1234   01/30/12 2000   ertapenem (INVANZ) 1 g in sodium chloride 0.9 % 50 mL IVPB        1 g 100 mL/hr over 30 Minutes Intravenous Every 24 hours 01/30/12 0155 02/05/12 2125   01/30/12 0154   ertapenem (INVANZ) 1 g in sodium chloride 0.9 % 50 mL IVPB  Status:  Discontinued        1  g 100 mL/hr over 30 Minutes Intravenous Every 24 hours 01/30/12 0154 01/30/12 0207   01/29/12 2045   ertapenem (INVANZ) 1 g in sodium chloride 0.9 % 50 mL IVPB  Status:  Discontinued        1 g 100 mL/hr over 30 Minutes Intravenous On call to O.R. 01/29/12 2033 01/30/12 0209   01/29/12 1530  piperacillin-tazobactam (ZOSYN) IVPB 3.375 g       3.375 g 12.5 mL/hr over 240 Minutes Intravenous  Once 01/29/12 1526 01/29/12 1931         Current Facility-Administered Medications  Medication Dose Route Frequency Provider Last Rate Last Dose  . 0.9 %  sodium chloride infusion   Intravenous Continuous Sherrie George, PA 100 mL/hr at 02/16/12 1403    . acetaminophen (TYLENOL) tablet 325-650 mg  325-650 mg Oral Q6H PRN Ardeth Sportsman, MD   650 mg at 02/07/12 0014  . alum & mag hydroxide-simeth (MAALOX/MYLANTA) 200-200-20 MG/5ML suspension 30 mL   30 mL Oral Q6H PRN Ardeth Sportsman, MD      . bisacodyl (DULCOLAX) suppository 10 mg  10 mg Rectal Q12H PRN Ardeth Sportsman, MD      . bisacodyl (DULCOLAX) suppository 10 mg  10 mg Rectal Daily Ardeth Sportsman, MD   10 mg at 02/08/12 1219  . diphenhydrAMINE (BENADRYL) injection 12.5-25 mg  12.5-25 mg Intravenous Q6H PRN Ardeth Sportsman, MD      . feeding supplement (ENSURE CLINICAL STRENGTH) liquid 237 mL  237 mL Oral BID BM Lavena Bullion, RD   237 mL at 02/16/12 1104  . ferrous fumarate (HEMOCYTE - 106 mg FE) tablet 106 mg of iron  1 tablet Oral QHS Sherrie George, PA   106 mg of iron at 02/16/12 2123  . Flora-Q (FLORA-Q) Capsule 1 capsule  1 capsule Oral Daily Ardeth Sportsman, MD   1 capsule at 02/16/12 1104  . fluconazole (DIFLUCAN) tablet 400 mg  400 mg Oral Daily Ardeth Sportsman, MD   400 mg at 02/16/12 1103  . magic mouthwash  15 mL Oral QID PRN Ardeth Sportsman, MD      . medroxyPROGESTERone (PROVERA) tablet 20 mg  20 mg Oral TID Antionette Char, MD   20 mg at 02/16/12 2123  . metroNIDAZOLE (FLAGYL) IVPB 500 mg  500 mg Intravenous Q8H Maryanna Shape Runyon, PHARMD   500 mg at 02/17/12 0506  . morphine 4 MG/ML injection 4 mg  4 mg Intravenous Q2H PRN Ardeth Sportsman, MD      . multivitamins with iron tablet 1 tablet  1 tablet Oral Daily Ardeth Sportsman, MD   1 tablet at 02/16/12 1103  . ondansetron (ZOFRAN) tablet 4 mg  4 mg Oral Q6H PRN Caleen Essex III, MD   4 mg at 02/13/12 1039   Or  . ondansetron (ZOFRAN) injection 4 mg  4 mg Intravenous Q6H PRN Caleen Essex III, MD   4 mg at 02/17/12 0423  . oxyCODONE (Oxy IR/ROXICODONE) immediate release tablet 5-15 mg  5-15 mg Oral Q4H PRN Freeman Caldron, PA   10 mg at 02/16/12 2133  . piperacillin-tazobactam (ZOSYN) IVPB 3.375 g  3.375 g Intravenous Q8H Randall K Absher, PHARMD   3.375 g at 02/17/12 0600  . promethazine (PHENERGAN) injection 12.5-25 mg  12.5-25 mg Intravenous Q6H PRN Ardeth Sportsman, MD   25 mg at 02/10/12 1540  . psyllium  (HYDROCIL/METAMUCIL) packet 1 packet  1 packet Oral  BID Ardeth Sportsman, MD   1 packet at 02/16/12 2123  . sodium chloride 0.9 % injection 3 mL  3 mL Intravenous Q12H Freeman Caldron, PA   3 mL at 02/14/12 2140    Assessment/Plan s/p Procedure(s):  LAPAROSCOPY DIAGNOSTIC  EXPLORATORY LAPAROTOMY  UNILATERAL SALPINGECTOMY  APPLICATION OF WOUND VAC  Perforated appendicitis with involvement of terminal ileum and fallopian tube, status post ileocecectomy and salpingectomy. 01/29/12 DR.Carolynne Edouard, 01/29/12 Post pelvic abscess , not amenable to drainage. Responding to antibiotics.  Postop anemia, tolerating reasonably well, on iron  Vancomycin toxicity, non-oliguric renal insufficiency, stable, not progressive, continued to monitor  POD 19  Plan:  For CT scan today, check C Diff, add flora q.  IV still at 100 ml/hr.  With no change in renal function since onset last week of increased creatinine.     LOS: 19 days    Ethan Kasperski 02/17/2012

## 2012-02-18 LAB — CBC
Platelets: 533 10*3/uL — ABNORMAL HIGH (ref 150–400)
RBC: 3.2 MIL/uL — ABNORMAL LOW (ref 3.87–5.11)
RDW: 16.8 % — ABNORMAL HIGH (ref 11.5–15.5)
WBC: 9.8 10*3/uL (ref 4.0–10.5)

## 2012-02-18 LAB — BASIC METABOLIC PANEL
Calcium: 9.2 mg/dL (ref 8.4–10.5)
Creatinine, Ser: 1.48 mg/dL — ABNORMAL HIGH (ref 0.50–1.10)
GFR calc Af Amer: 56 mL/min — ABNORMAL LOW (ref >90)
GFR calc non Af Amer: 48 mL/min — ABNORMAL LOW (ref >90)
Sodium: 134 mEq/L — ABNORMAL LOW (ref 135–145)

## 2012-02-18 MED ORDER — BOOST / RESOURCE BREEZE PO LIQD
1.0000 | Freq: Two times a day (BID) | ORAL | Status: DC
  Administered 2012-02-18 – 2012-02-19 (×2): 1 via ORAL

## 2012-02-18 NOTE — Progress Notes (Signed)
Nutrition Follow-up  Diet Order: Regular, Ensure Clinical Strength BID  - POD #20 ileocecectomy and salpingectomy for perforated appendicitis with involvement of terminal ileum and fallopian tube. Pt with significant anemia with hemoglobin of 6.8 today, however pt refusing blood transfusion. Pt on multivitamin with iron and iron supplement. Wound vac still draining blood, and wound has decreased in size. Pt reports eating well, intake 50-100% of meals, and reports consuming foods high in iron. Pt not drinking Ensure anymore r/t not liking the taste. Provided pt with [employer] which she liked, will order instead.   Meds: Scheduled Meds:   . bisacodyl  10 mg Rectal Daily  . feeding supplement  237 mL Oral BID BM  . ferrous fumarate  1 tablet Oral QHS  . Flora-Q  1 capsule Oral Daily  . fluconazole  400 mg Oral Daily  . medroxyPROGESTERone  20 mg Oral TID  . metronidazole  500 mg Intravenous Q8H  . multivitamins with iron  1 tablet Oral Daily  . piperacillin-tazobactam (ZOSYN)  IV  3.375 g Intravenous Q8H  . psyllium  1 packet Oral BID  . sodium chloride  3 mL Intravenous Q12H   Continuous Infusions:   . sodium chloride 100 mL/hr at 02/17/12 2007   PRN Meds:.acetaminophen, alum & mag hydroxide-simeth, bisacodyl, diphenhydrAMINE, iohexol, magic mouthwash, morphine, ondansetron (ZOFRAN) IV, ondansetron, oxyCODONE, promethazine  Labs:  CMP     Component Value Date/Time   NA 134* 02/18/2012 0405   K 4.0 02/18/2012 0405   CL 101 02/18/2012 0405   CO2 23 02/18/2012 0405   GLUCOSE 88 02/18/2012 0405   BUN 7 02/18/2012 0405   CREATININE 1.48* 02/18/2012 0405   CALCIUM 9.2 02/18/2012 0405   PROT 6.4 02/07/2012 1134   ALBUMIN 2.0* 02/07/2012 1134   AST 18 02/07/2012 1134   ALT 9 02/07/2012 1134   ALKPHOS 60 02/07/2012 1134   BILITOT 0.5 02/07/2012 1134   GFRNONAA 48* 02/18/2012 0405   GFRAA 56* 02/18/2012 0405     Intake/Output Summary (Last 24 hours) at 02/18/12 1014 Last data filed at 02/18/12  0703  Gross per 24 hour  Intake 1209.8 ml  Output   2600 ml  Net -1390.2 ml   Last BM - 02/17/12  Weight Status:  No new weights  Estimated needs: 1800-2150 calories, 105-125g protein  Nutrition Dx: Inadequate oral intake - resolved   Goal: Pt to consume >75% of meals/supplements - met based on average intake.   Intervention: D/C Ensure, Resource Breeze BID. Encouraged continued excellent intake.   Monitor: Weights, labs, intake, hemoglobin    Marshall Cork Pager #: [phone]

## 2012-02-18 NOTE — Progress Notes (Signed)
20 Days Post-Op  Subjective: She feels fine bowels a little loose. Afebrile, VSS, Objective: Vital signs in last 24 hours: Temp:  [98.4 F (36.9 C)-98.8 F (37.1 C)] 98.4 F (36.9 C) (03/19 0600) Pulse Rate:  [72-78] 72  (03/19 0600) Resp:  [18] 18  (03/19 0600) BP: (118-134)/(60-74) 118/67 mmHg (03/19 0600) SpO2:  [99 %-100 %] 99 % (03/19 0600) Last BM Date: 02/17/12  Intake/Output from previous day: 03/18 0701 - 03/19 0700 In: 1159.8 [I.V.:909.8; IV Piggyback:250] Out: 2600 [Urine:2600] Intake/Output this shift: Total I/O In: 50 [IV Piggyback:50] Out: -   General appearance: alert, cooperative and no distress Resp: clear to auscultation bilaterally GI: soft, non-tender; bowel sounds normal; no masses,  no organomegaly and Vac in place.  Lab Results:   Basename 02/18/12 0405 02/16/12 0548  WBC 9.8 9.8  HGB 6.8* 6.6*  HCT 22.0* 21.0*  PLT 533* 581*    BMET  Basename 02/18/12 0405 02/17/12 0425  NA 134* 136  K 4.0 4.1  CL 101 103  CO2 23 24  GLUCOSE 88 89  BUN 7 8  CREATININE 1.48* 1.61*  CALCIUM 9.2 9.2   PT/INR No results found for this basename: LABPROT:2,INR:2 in the last 72 hours  No results found for this basename: AST:5,ALT:5,ALKPHOS:5,BILITOT:5,PROT:5,ALBUMIN:5 in the last 168 hours   Lipase     Component Value Date/Time   LIPASE 71 03/01/2010 1245     Studies/Results: Ct Abdomen Pelvis W Contrast  02/17/2012  *RADIOLOGY REPORT*  Clinical Data: Postop.  Reevaluate pelvic fluid collection.  CT ABDOMEN AND PELVIS WITH CONTRAST  Technique:  Multidetector CT imaging of the abdomen and pelvis was performed following the standard protocol during bolus administration of intravenous contrast.  Contrast: OMNIPAQUE IOHEXOL 300 MG/ML IJ SOLN  Comparison: 02/11/2012.  Findings: Lung bases show atelectasis in the left lower lobe. Heart is at the upper limits of normal in size.  No pericardial or pleural effusion.  Liver, gallbladder and adrenal glands  unremarkable.  The kidneys appear slightly striated in appearance.  No hydronephrosis. Spleen, pancreas, stomach and small bowel are unremarkable. Postoperative changes are seen in the region of the distal small bowel and cecum.  At least two low attenuation fluid collections remain in the central anatomic pelvis, measuring up to 2.1 x 3.3 cm (image 78), stable.  Remainder of the colon is unremarkable.  Scattered lymph nodes are not enlarged by CT size criteria.  No worrisome lytic or sclerotic lesions.  IMPRESSION:  1.  Persistent organized fluid collections in the central anatomic pelvis, stable. 2.  Mildly striated appearance of the kidneys bilaterally.  Given the stability from 02/11/2012, subacute renal failure is considered.  Pyelonephritis cannot be excluded.  Original Report Authenticated By: Reyes Ivan, M.D.    Medications:    . bisacodyl  10 mg Rectal Daily  . feeding supplement  237 mL Oral BID BM  . ferrous fumarate  1 tablet Oral QHS  . Flora-Q  1 capsule Oral Daily  . fluconazole  400 mg Oral Daily  . medroxyPROGESTERone  20 mg Oral TID  . metronidazole  500 mg Intravenous Q8H  . multivitamins with iron  1 tablet Oral Daily  . piperacillin-tazobactam (ZOSYN)  IV  3.375 g Intravenous Q8H  . psyllium  1 packet Oral BID  . sodium chloride  3 mL Intravenous Q12H    Assessment/Plan s/p Procedure(s):  LAPAROSCOPY DIAGNOSTIC  EXPLORATORY LAPAROTOMY  UNILATERAL SALPINGECTOMY  APPLICATION OF WOUND VAC  Perforated appendicitis with involvement  of terminal ileum and fallopian tube, status post ileocecectomy and salpingectomy. 01/29/12 DR.Carolynne Edouard, 01/29/12  Post pelvic abscess , not amenable to drainage. Responding to antibiotics.  Postop anemia, tolerating reasonably well, on iron  Vancomycin toxicity, non-oliguric renal insufficiency, stable, not progressive, continued to monitor  POD 20  Plan:  Stop antibiotics, watch fever curve and labs, If she does well home soon. Ct shows  persistent organized fluid but improving.    LOS: 20 days    Hani Campusano 02/18/2012

## 2012-02-19 ENCOUNTER — Other Ambulatory Visit (INDEPENDENT_AMBULATORY_CARE_PROVIDER_SITE_OTHER): Payer: Self-pay | Admitting: General Surgery

## 2012-02-19 ENCOUNTER — Telehealth (INDEPENDENT_AMBULATORY_CARE_PROVIDER_SITE_OTHER): Payer: Self-pay

## 2012-02-19 DIAGNOSIS — D649 Anemia, unspecified: Secondary | ICD-10-CM

## 2012-02-19 LAB — BASIC METABOLIC PANEL
CO2: 24 mEq/L (ref 19–32)
Chloride: 104 mEq/L (ref 96–112)
GFR calc Af Amer: 58 mL/min — ABNORMAL LOW (ref >90)
Sodium: 138 mEq/L (ref 135–145)

## 2012-02-19 LAB — CBC
HCT: 21.5 % — ABNORMAL LOW (ref 36.0–46.0)
MCV: 68.9 fL — ABNORMAL LOW (ref 78.0–100.0)
Platelets: 538 10*3/uL — ABNORMAL HIGH (ref 150–400)
RBC: 3.12 MIL/uL — ABNORMAL LOW (ref 3.87–5.11)
RDW: 17.5 % — ABNORMAL HIGH (ref 11.5–15.5)
WBC: 9.2 10*3/uL (ref 4.0–10.5)

## 2012-02-19 MED ORDER — OXYCODONE-ACETAMINOPHEN 5-325 MG PO TABS: 1.0000 | ORAL_TABLET | ORAL | Status: AC | PRN

## 2012-02-19 MED ORDER — ACETAMINOPHEN 325 MG PO TABS: 325.0000 mg | ORAL_TABLET | Freq: Four times a day (QID) | ORAL | Status: AC | PRN

## 2012-02-19 MED ORDER — FERROUS FUMARATE 325 (106 FE) MG PO TABS: 1.0000 | ORAL_TABLET | Freq: Every day | ORAL | Status: AC

## 2012-02-19 MED ORDER — FLORA-Q PO CAPS: 1.0000 | ORAL_CAPSULE | Freq: Every day | ORAL | Status: DC

## 2012-02-19 MED ORDER — FERROUS GLUCONATE 325 (36 FE) MG PO TABS: 1.0000 | ORAL_TABLET | Freq: Every day | ORAL | Status: DC

## 2012-02-19 MED ORDER — TAB-A-VITE/IRON PO TABS: 1.0000 | ORAL_TABLET | Freq: Every day | ORAL | Status: AC

## 2012-02-19 NOTE — Telephone Encounter (Signed)
We received a fax request from [facility] for switching Ferrous Fumarate to gluconate or sulfate.  I got the order from Will Greenback to switch to Ferrous Gluconate.  I put the order in and faxed it to [facility]

## 2012-02-19 NOTE — Progress Notes (Signed)
21 Days Post-Op  Subjective: Tm99.1 VSS, creatinine improving, She feels very good  Objective: Vital signs in last 24 hours: Temp:  [98.3 F (36.8 C)-99.1 F (37.3 C)] 99.1 F (37.3 C) (03/20 0549) Pulse Rate:  [60-87] 66  (03/20 0549) Resp:  [18] 18  (03/20 0549) BP: (121-135)/(70-73) 121/70 mmHg (03/20 0549) SpO2:  [98 %-100 %] 98 % (03/20 0549) Last BM Date: 02/19/12  Intake/Output from previous day: 03/19 0701 - 03/20 0700 In: 1102 [P.O.:720; I.V.:182; IV Piggyback:200] Out: -  Intake/Output this shift: Total I/O In: 240 [P.O.:240] Out: 300 [Urine:300]  General appearance: alert, appears stated age and no distress Resp: clear to auscultation bilaterally GI: soft, non-tender; bowel sounds normal; no masses,  no organomegaly and Wound vac site looks great  Lab Results:   Basename 02/19/12 0440 02/18/12 0405  WBC 9.2 9.8  HGB 6.7* 6.8*  HCT 21.5* 22.0*  PLT 538* 533*    BMET  Basename 02/19/12 0440 02/18/12 0405  NA 138 134*  K 3.9 4.0  CL 104 101  CO2 24 23  GLUCOSE 88 88  BUN 6 7  CREATININE 1.43* 1.48*  CALCIUM 9.1 9.2   PT/INR No results found for this basename: LABPROT:2,INR:2 in the last 72 hours  No results found for this basename: AST:5,ALT:5,ALKPHOS:5,BILITOT:5,PROT:5,ALBUMIN:5 in the last 168 hours   Lipase     Component Value Date/Time   LIPASE 71 03/01/2010 1245     Studies/Results: No results found.  Medications:    . bisacodyl  10 mg Rectal Daily  . feeding supplement  1 Container Oral BID BM  . ferrous fumarate  1 tablet Oral QHS  . Flora-Q  1 capsule Oral Daily  . fluconazole  400 mg Oral Daily  . medroxyPROGESTERone  20 mg Oral TID  . metronidazole  500 mg Intravenous Q8H  . multivitamins with iron  1 tablet Oral Daily  . piperacillin-tazobactam (ZOSYN)  IV  3.375 g Intravenous Q8H  . psyllium  1 packet Oral BID  . sodium chloride  3 mL Intravenous Q12H    Assessment/Plan s/p Procedure(s):  LAPAROSCOPY DIAGNOSTIC    EXPLORATORY LAPAROTOMY  UNILATERAL SALPINGECTOMY  APPLICATION OF WOUND VAC  Perforated appendicitis with involvement of terminal ileum and fallopian tube, status post ileocecectomy and salpingectomy. 01/29/12 DR.Carolynne Edouard, 01/29/12  Post pelvic abscess , not amenable to drainage. Responding to antibiotics.  Postop anemia, tolerating reasonably well, on iron  Vancomycin toxicity, non-oliguric renal insufficiency, stable, not progressive, continued to monitor  POD 20   Plan home today off antibiotics, Follow up in office next week  Recheck labs next week     LOS: 21 days    [name] [name] 02/19/2012

## 2012-02-19 NOTE — Discharge Instructions (Signed)
Open Appendectomy Appendectomy is surgery to remove the appendix. Open appendectomy means your surgeon uses one large cut (incision) instead of several small incisions. LET YOUR CAREGIVER KNOW ABOUT:  Allergies to food or medicine.   Medicines taken, including vitamins, dietary supplements, herbs, eyedrops, over-the-counter medicines, and creams.   Use of steroids (by mouth or creams).   Previous problems with anesthetics or numbing medicines.   History of bleeding problems or blood clots.   Previous surgery.   Other health problems, including diabetes, heart problems, lung problems, and kidney problems.   Possibility of pregnancy, if this applies.  RISKS AND COMPLICATIONS  Infection. A germ starts growing in the wound. This can usually be treated with antibiotics. In some cases, the wound will need to be opened and cleaned.   Bleeding.   Damage to other organs.   Sores (abscesses).   Chronic pain at the incision sites. This is defined as pain that lasts for more than 3 months.   Blood clots in the legs that may rarely travel to the lungs.   Infection in the lungs (pneumonia).  BEFORE THE PROCEDURE Appendectomy is usually performed immediately after an inflamed appendix (appendicitis) is diagnosed. No preparation is necessary ahead of this procedure. PROCEDURE  You will be given medicine that makes you sleep (general anesthetic). Your surgeon will make an incision in the right, lower part of the abdomen. Your appendix will be removed. Your abdomen will be closed with stitches (sutures). AFTER THE PROCEDURE You will be taken to a recovery room. When the anesthesia has worn off, you will be returned to your hospital room. You will be given pain medicines to keep you comfortable. Ask your caregiver how long your hospital stay will be. Document Released: 07/02/2004 Document Revised: 11/07/2011 Document Reviewed: 05/28/2011 [facility] Patient Information 2012 [place], [place].CCS       [facility], [place] [phone]  OPEN ABDOMINAL SURGERY: POST OP INSTRUCTIONS  Always review your discharge instruction sheet given to you by the facility where your surgery was performed.  IF YOU HAVE DISABILITY OR FAMILY LEAVE FORMS, YOU MUST BRING THEM TO THE OFFICE FOR PROCESSING.  PLEASE DO NOT GIVE THEM TO YOUR DOCTOR.  1. A prescription for pain medication may be given to you upon discharge.  Take your pain medication as prescribed, if needed.  If narcotic pain medicine is not needed, then you may take acetaminophen (Tylenol) or ibuprofen (Advil) as needed. 2. Take your usually prescribed medications unless otherwise directed. 3. If you need a refill on your pain medication, please contact your pharmacy. They will contact our office to request authorization.  Prescriptions will not be filled after 5pm or on week-ends. 4. You should follow a light diet the first few days after arrival home, such as soup and crackers, pudding, etc.unless your doctor has advised otherwise. A high-fiber, low fat diet can be resumed as tolerated.   Be sure to include lots of fluids daily. Most patients will experience some swelling and bruising on the chest and neck area.  Ice packs will help.  Swelling and bruising can take several days to resolve 5. Most patients will experience some swelling and bruising in the area of the incision. Ice pack will help. Swelling and bruising can take several days to resolve..  6. It is common to experience some constipation if taking pain medication after surgery.  Increasing fluid intake and taking a stool softener will usually help or prevent this problem from occurring.  A mild laxative (Milk  of Magnesia or Miralax) should be taken according to package directions if there are no bowel movements after 48 hours. 7.  You may have steri-strips (small skin tapes) in place directly over the incision.  These strips should be left on the skin for 7-10 days.  If your surgeon  used skin glue on the incision, you may shower in 24 hours.  The glue will flake off over the next 2-3 weeks.  Any sutures or staples will be removed at the office during your follow-up visit. You may find that a light gauze bandage over your incision may keep your staples from being rubbed or pulled. You may shower and replace the bandage daily. 8. ACTIVITIES:  You may resume regular (light) daily activities beginning the next day--such as daily self-care, walking, climbing stairs--gradually increasing activities as tolerated.  You may have sexual intercourse when it is comfortable.  Refrain from any heavy lifting or straining until approved by your doctor. a. You may drive when you no longer are taking prescription pain medication, you can comfortably wear a seatbelt, and you can safely maneuver your car and apply brakes b. Return to Work: ___________________________________ 9. You should see your doctor in the office for a follow-up appointment approximately two weeks after your surgery.  Make sure that you call for this appointment within a day or two after you arrive home to insure a convenient appointment time. OTHER INSTRUCTIONS:  _____________________________________________________________ _____________________________________________________________  WHEN TO CALL YOUR DOCTOR: 1. Fever over 101.0 2. Inability to urinate 3. Nausea and/or vomiting 4. Extreme swelling or bruising 5. Continued bleeding from incision. 6. Increased pain, redness, or drainage from the incision. 7. Difficulty swallowing or breathing 8. Muscle cramping or spasms. 9. Numbness or tingling in hands or feet or around lips.  The clinic staff is available to answer your questions during regular business hours.  Please don't hesitate to call and ask to speak to one of the nurses if you have concerns.  For further questions, please visit [email or web address]

## 2012-02-20 ENCOUNTER — Telehealth (INDEPENDENT_AMBULATORY_CARE_PROVIDER_SITE_OTHER): Payer: Self-pay | Admitting: General Surgery

## 2012-02-20 NOTE — Telephone Encounter (Signed)
Pt calling to request post op appt with Dr. Carolynne Edouard.  Please call her at  [phone].

## 2012-02-25 ENCOUNTER — Telehealth (INDEPENDENT_AMBULATORY_CARE_PROVIDER_SITE_OTHER): Payer: Self-pay | Admitting: General Surgery

## 2012-02-25 ENCOUNTER — Ambulatory Visit (INDEPENDENT_AMBULATORY_CARE_PROVIDER_SITE_OTHER): Payer: Managed Care, Other (non HMO) | Admitting: General Surgery

## 2012-02-25 ENCOUNTER — Encounter (INDEPENDENT_AMBULATORY_CARE_PROVIDER_SITE_OTHER): Payer: Self-pay | Admitting: General Surgery

## 2012-02-25 VITALS — BP 136/86 | HR 80 | Temp 97.9°F | Resp 12 | Ht 71.0 in | Wt 198.4 lb

## 2012-02-25 DIAGNOSIS — K3580 Unspecified acute appendicitis: Secondary | ICD-10-CM

## 2012-02-25 NOTE — Telephone Encounter (Signed)
Notified Nurse, adult with [facility] that the wound vac was removed today and it is being discontinued and the patient is going to do her own dressing changes bid. [facility] to pick up wound vac 02/26/12

## 2012-02-25 NOTE — Patient Instructions (Signed)
Gauze dressing changes to abd twice a day Shower Discontinue Vac

## 2012-02-25 NOTE — Progress Notes (Signed)
Subjective:     Patient ID: [name] [name], female   DOB: October 19, 1986, 26 y.o.   MRN: [id number]  HPI The patient is a 26 year old white female who is almost a month out from an open appendectomy. Her postoperative course was complicated by an ileus. This has now resolved and she is at home. She has no complaints today. She has only minor soreness. Her appetite is good and her bowels are working normally. She still has a vacuum dressing on her abdominal wall.  Review of Systems     Objective:   Physical Exam On exam her abdomen is soft and nontender. Her vacuum dressing was removed. The small open wound underneath the VAC is very clean and shallow with good granulation tissue.    Assessment:     One month status post open appendectomy    Plan:     Our plan at this point is to discontinue the vacuum dressing. We will start wet to dry dressing changes and let her shower everyday. I will plan to see her back in 2-3 weeks to recheck the wound.

## 2012-03-05 ENCOUNTER — Telehealth (INDEPENDENT_AMBULATORY_CARE_PROVIDER_SITE_OTHER): Payer: Self-pay

## 2012-03-05 NOTE — Telephone Encounter (Signed)
Patient returned phone call. Please call her back.

## 2012-03-05 NOTE — Telephone Encounter (Signed)
Next post op appointment information left on voicemail. Patient called yesterday thinking she had an appointment here on 03/17/2012.  Patient  also left message requesting to be out of work longer.  She stated  'still have a hole, and feel uncomfortable going back to work.' I have returned the patient's call twice. Waiting to get more information.

## 2012-03-06 ENCOUNTER — Telehealth (INDEPENDENT_AMBULATORY_CARE_PROVIDER_SITE_OTHER): Payer: Self-pay

## 2012-03-06 NOTE — Telephone Encounter (Signed)
Message left again to return phone call.

## 2012-03-06 NOTE — Telephone Encounter (Signed)
C/O  Wound still still draining and not yet healed on abdomen.  Patient has follow up appointment on March 26, 2012 for perforated appendicitis. The patient would like to return to work after follow up appointment.  Short term disability paper works has return to work on 03/12/2012.  She would like to know if we can change the date?

## 2012-03-09 NOTE — Telephone Encounter (Signed)
Copy to Dr. Toth 

## 2012-03-09 NOTE — Telephone Encounter (Signed)
If you can find time

## 2012-03-10 ENCOUNTER — Telehealth (INDEPENDENT_AMBULATORY_CARE_PROVIDER_SITE_OTHER): Payer: Self-pay | Admitting: General Surgery

## 2012-03-12 NOTE — Telephone Encounter (Signed)
Patient called for an update. She told her work when the next post op appointment will be here in the office. I told the patient that will fax the doctor's notes on the same day of the office visit.

## 2012-03-25 ENCOUNTER — Encounter (INDEPENDENT_AMBULATORY_CARE_PROVIDER_SITE_OTHER): Payer: Self-pay

## 2012-03-26 ENCOUNTER — Encounter (INDEPENDENT_AMBULATORY_CARE_PROVIDER_SITE_OTHER): Payer: Self-pay | Admitting: General Surgery

## 2012-03-26 ENCOUNTER — Ambulatory Visit (INDEPENDENT_AMBULATORY_CARE_PROVIDER_SITE_OTHER): Payer: Managed Care, Other (non HMO) | Admitting: General Surgery

## 2012-03-26 VITALS — BP 118/78 | HR 80 | Temp 97.1°F | Resp 18 | Ht 71.0 in | Wt 203.8 lb

## 2012-03-26 DIAGNOSIS — K3580 Unspecified acute appendicitis: Secondary | ICD-10-CM

## 2012-03-26 NOTE — Progress Notes (Signed)
Subjective:     Patient ID: [name] [name], female   DOB: [birth year], 26 y.o.   MRN: [id number]  HPI The patient is a 26 and white female who is about 2 months out now from an open appendectomy. We also had a direct second part of her terminal ileum and cecum. She tolerated this well. She is feeling good and has no complaints today. She is scheduled to go back to work tomorrow And she feels ready  Review of Systems     Objective:   Physical Exam On exam her abdomen is soft and nontender. Her midline incision is almost completely healed with just about a millimeter of granulation tissue. There is no sign of infection.    Assessment:     2 months status post open appendectomy with resection of part of her terminal ileum and cecum    Plan:     At this point she seems to be doing very well. I think he would be fine for her to start returning to work tomorrow. We will plan to see her back on a p.r.n. basis.

## 2012-03-26 NOTE — Patient Instructions (Signed)
?   Ok to return to work tomorrow?

## 2012-08-08 ENCOUNTER — Other Ambulatory Visit (INDEPENDENT_AMBULATORY_CARE_PROVIDER_SITE_OTHER): Payer: Self-pay | Admitting: General Surgery

## 2012-10-08 ENCOUNTER — Ambulatory Visit: Payer: Managed Care, Other (non HMO) | Admitting: Family Medicine

## 2013-07-17 IMAGING — CR DG CHEST 2V
2 series · 2 of 2 positions shown · non-contrast
Comparison: None

CLINICAL DATA: 25-year-old female with cough and fever.

CHEST - 2 VIEW

[w chest pa]
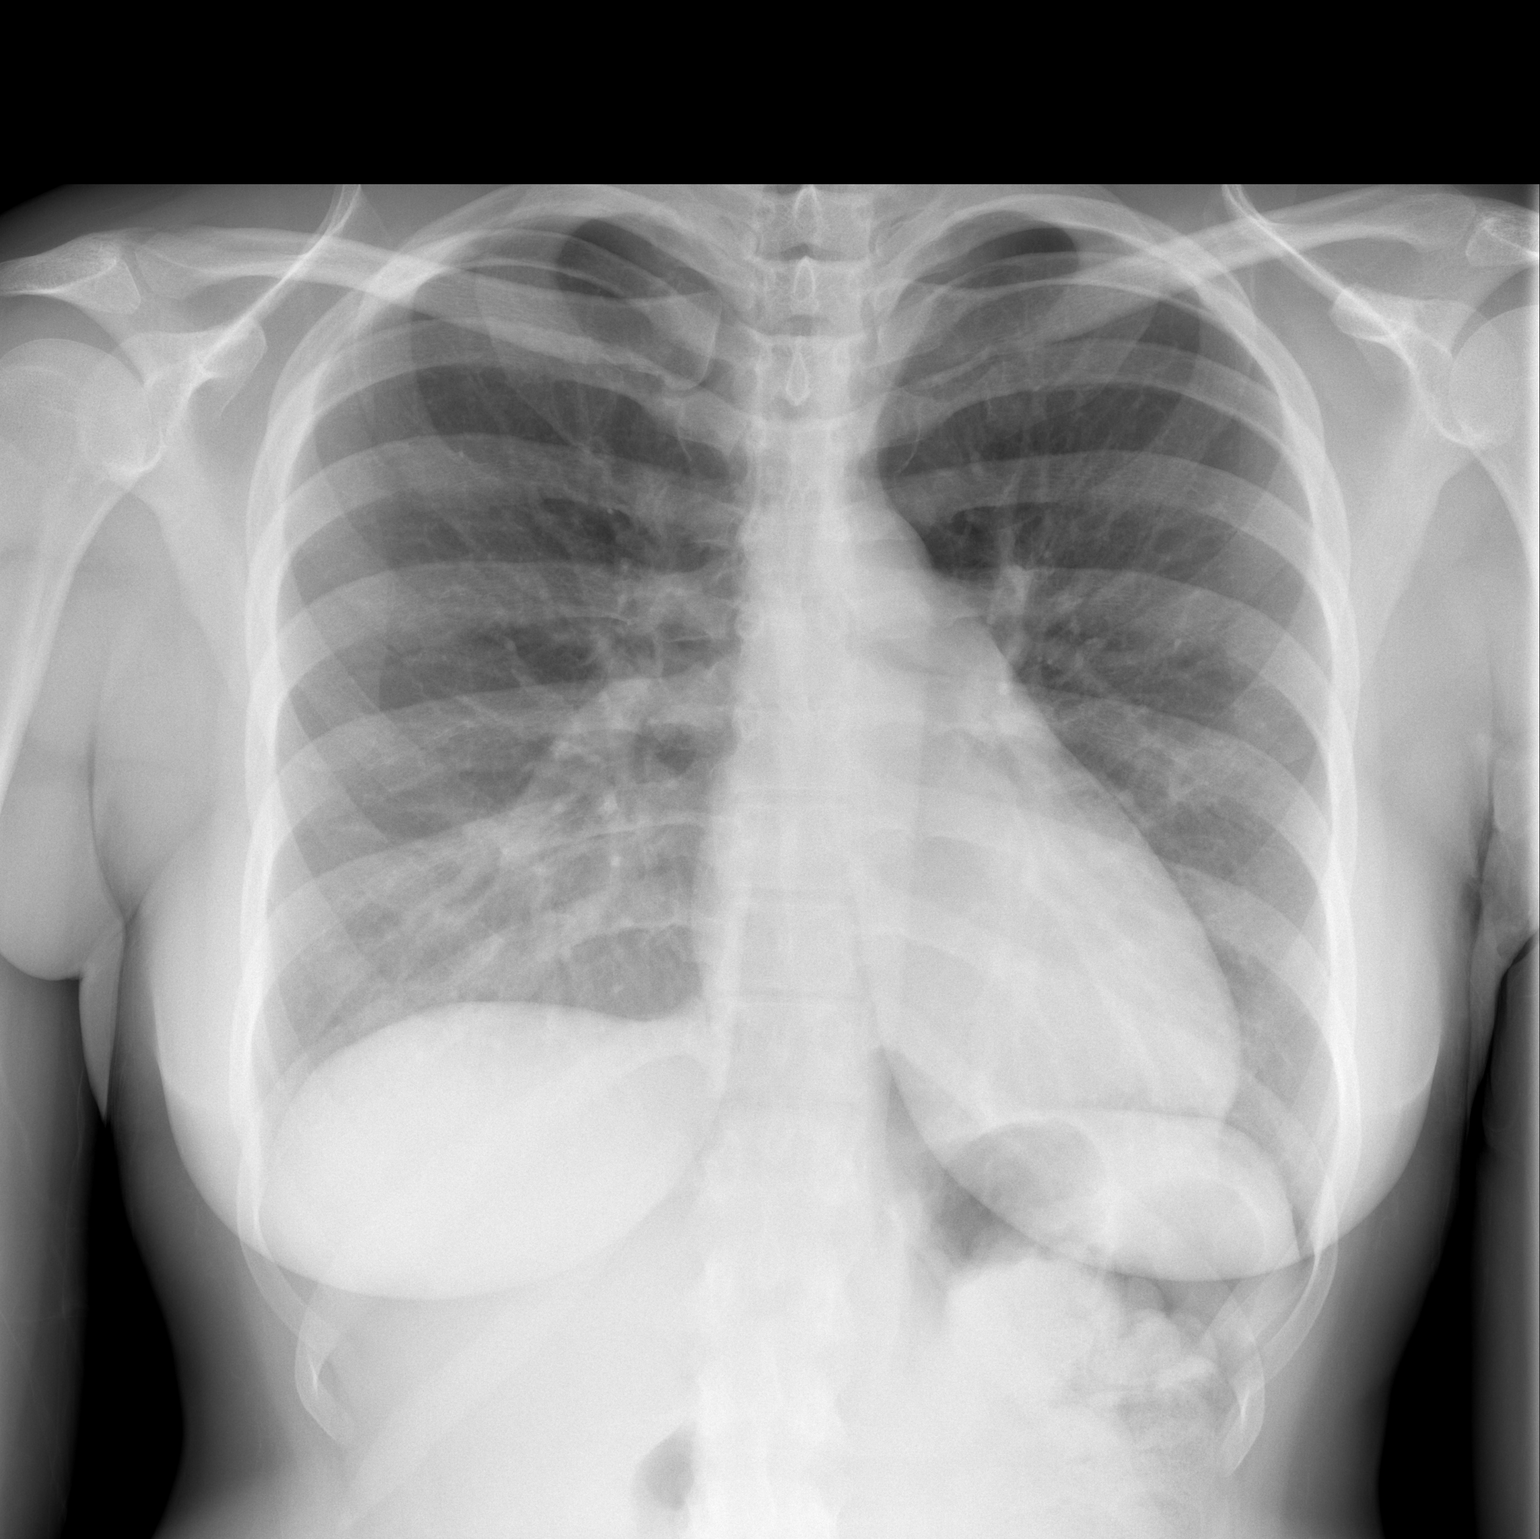

[w chest lat]
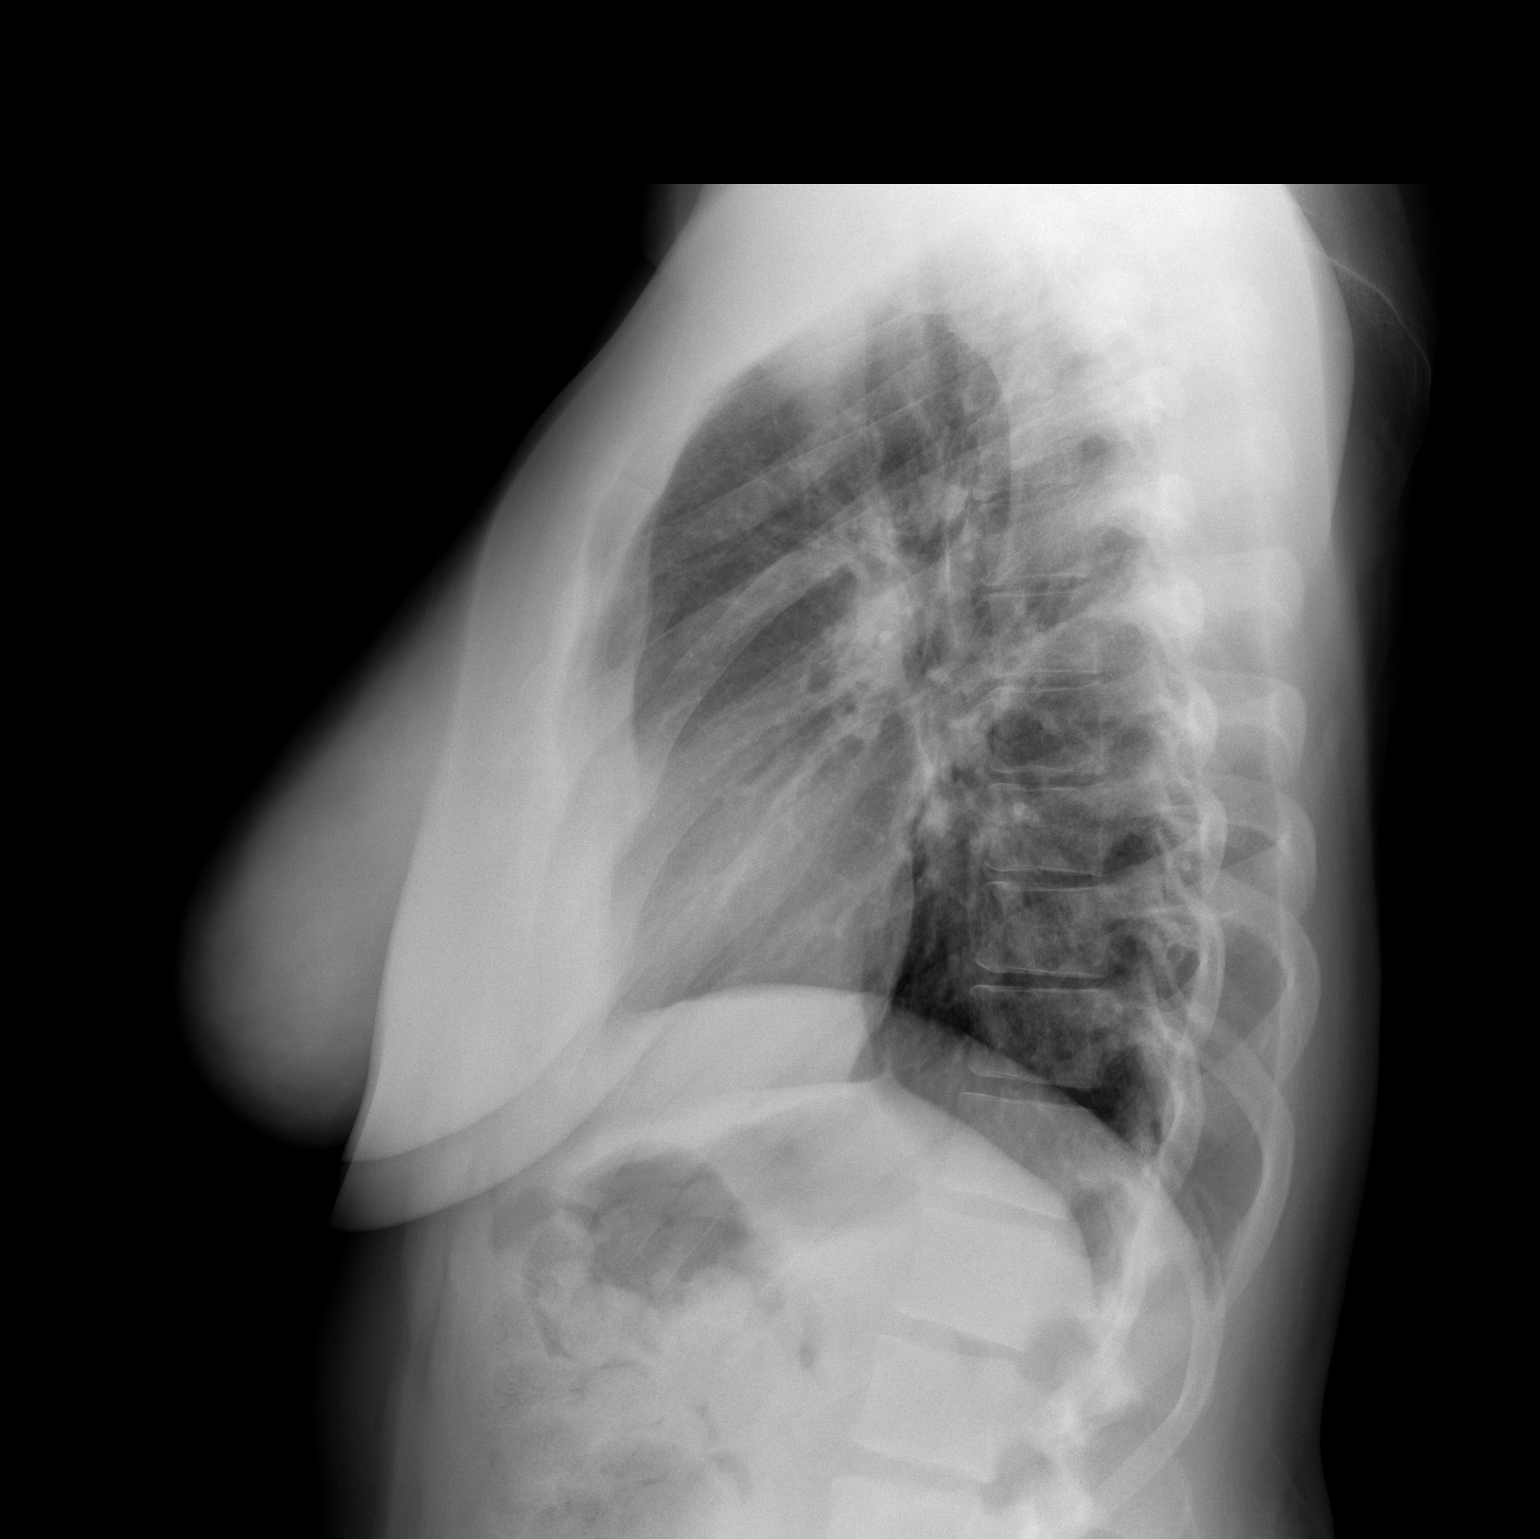

[2 of 2 positions shown; findings below may reference images not displayed]

FINDINGS: The cardiomediastinal silhouette is unremarkable.
There is no evidence of focal airspace disease, pulmonary edema,
suspicious pulmonary nodule/mass, pleural effusion, or
pneumothorax.
No acute bony abnormalities are identified.
IMPRESSION: No evidence of active cardiopulmonary disease.

## 2013-07-22 IMAGING — CT CT ABD-PELV W/ CM
2 of 3 series · 15 of 46 positions shown, 17 images · IV contrast (agent unspecified)
Comparison: 03/01/2010

CLINICAL DATA: Abdominal pain over the past 6 days with nausea and
vomiting.  Fever.

CT ABDOMEN AND PELVIS WITH CONTRAST
TECHNIQUE: Multidetector CT imaging of the abdomen and pelvis was
performed following the standard protocol during bolus
administration of intravenous contrast.
Contrast:  100 ml Pmnipaque-NAA

[Series 2: abd/pelvis 5.0 b31f · axial · 0.67mm/px · z∈[-376,+30]mm · 12 of 93 slices shown, 14 images]
[im 6/93  soft-tissue]
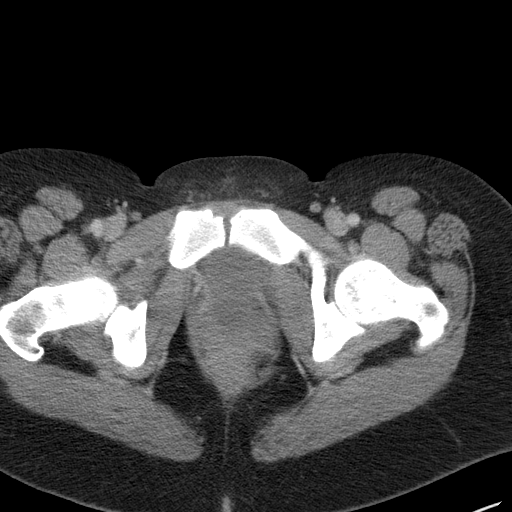
[im 6/93  bone]
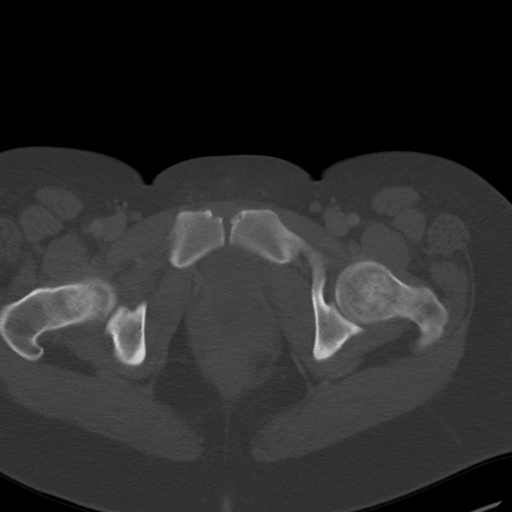
[im 12/93  soft-tissue]
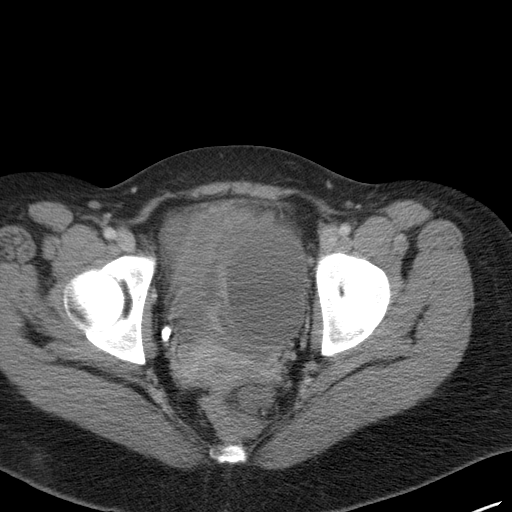
[im 21/93  soft-tissue]
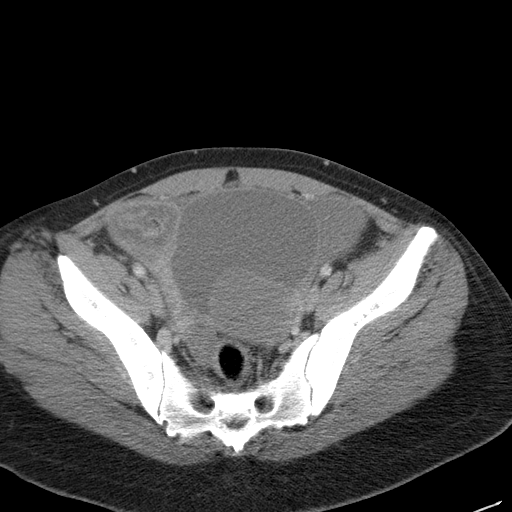
[im 27/93  soft-tissue]
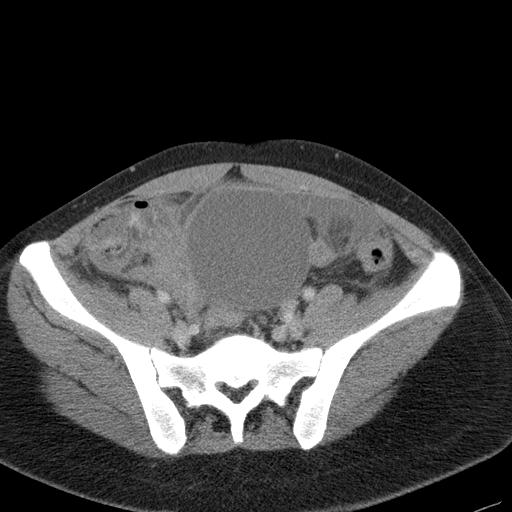
[im 36/93  soft-tissue]
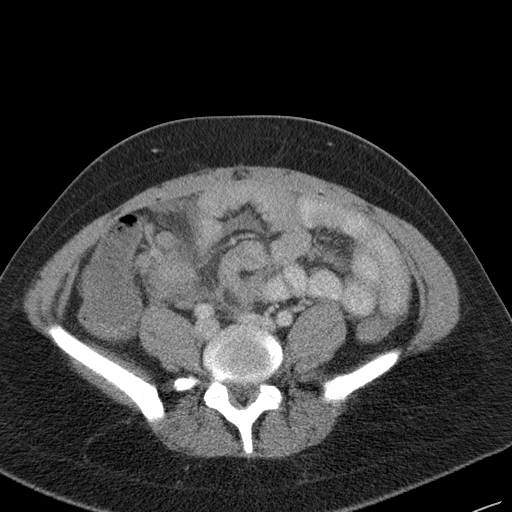
[im 42/93  soft-tissue]
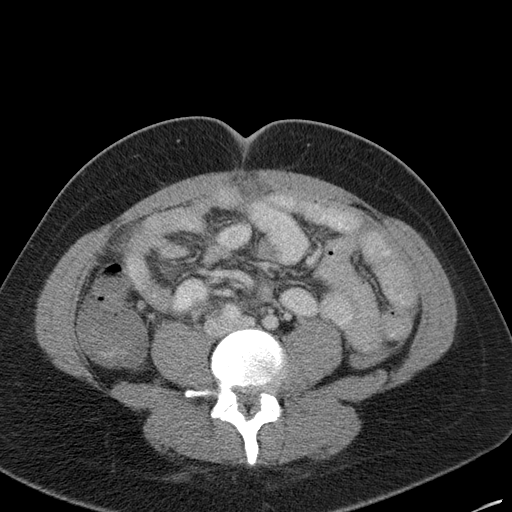
[im 51/93  soft-tissue]
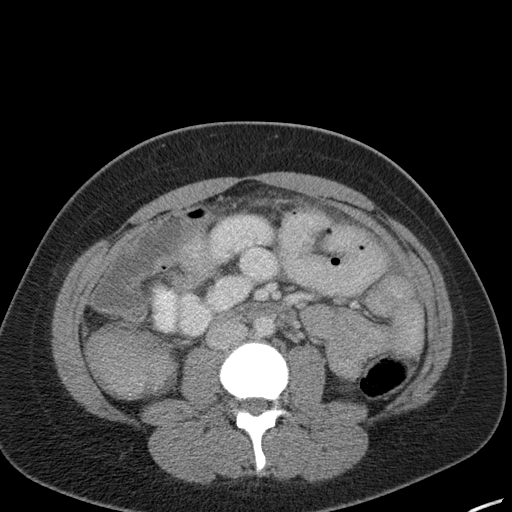
[im 57/93  soft-tissue]
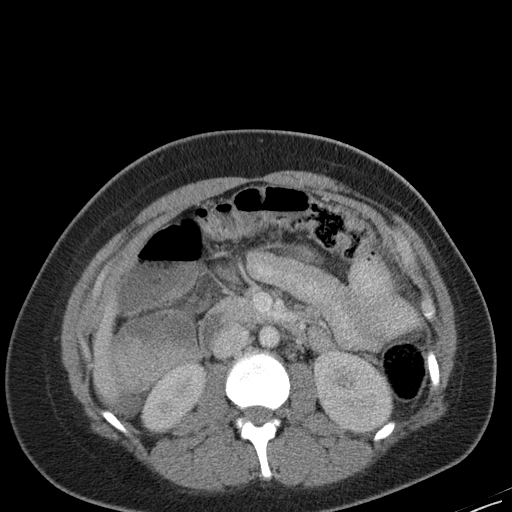
[im 66/93  soft-tissue]
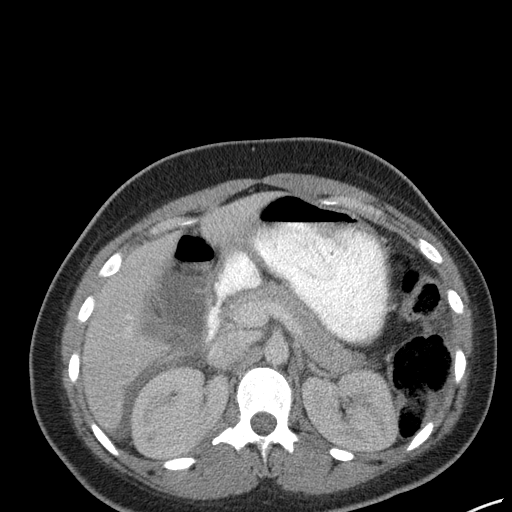
[im 66/93  bone]
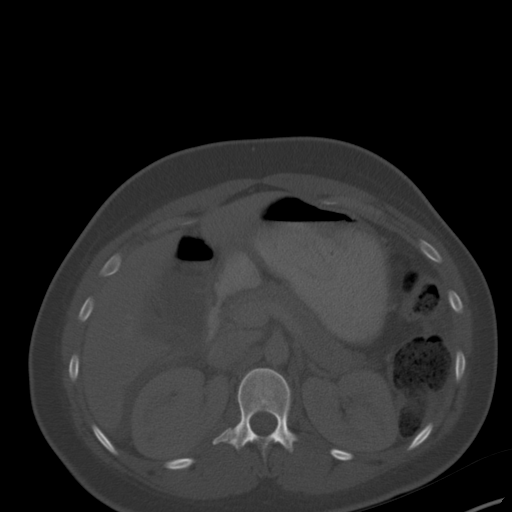
[im 72/93  soft-tissue]
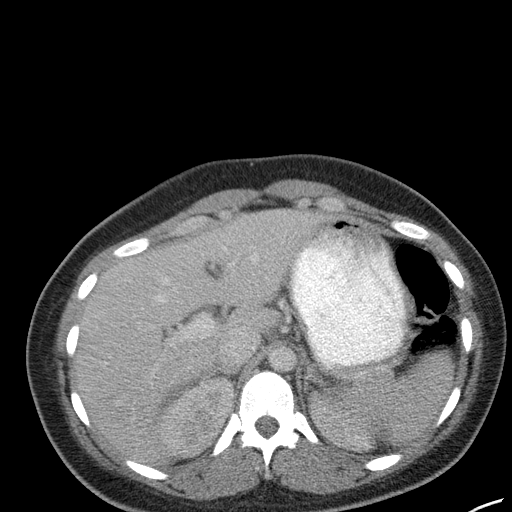
[im 81/93  soft-tissue]
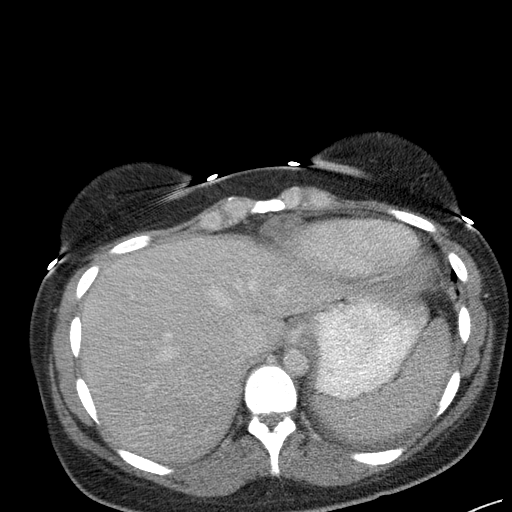
[im 87/93  soft-tissue]
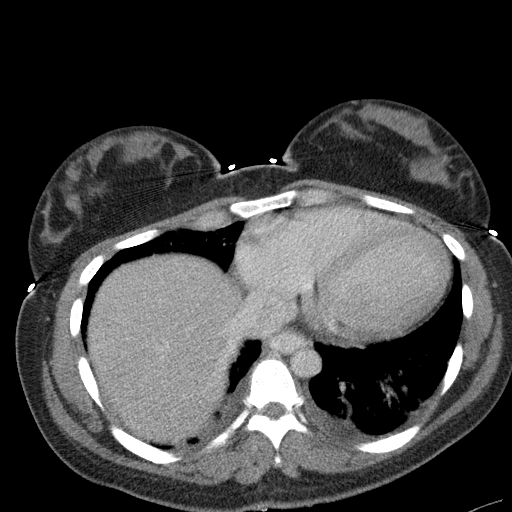

[Series 5: abd/pelvis 3.0 coronal · coronal · 0.64mm/px · 3 of 93 slices shown]
[im 31/93  soft-tissue]
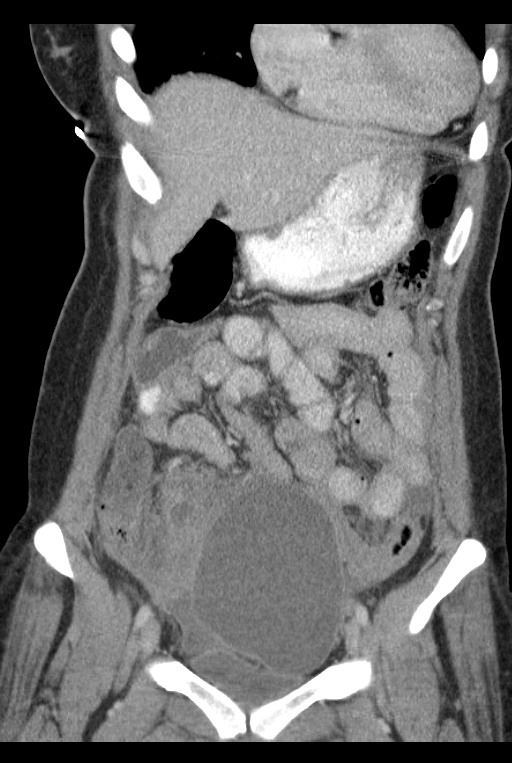
[im 41/93  soft-tissue]
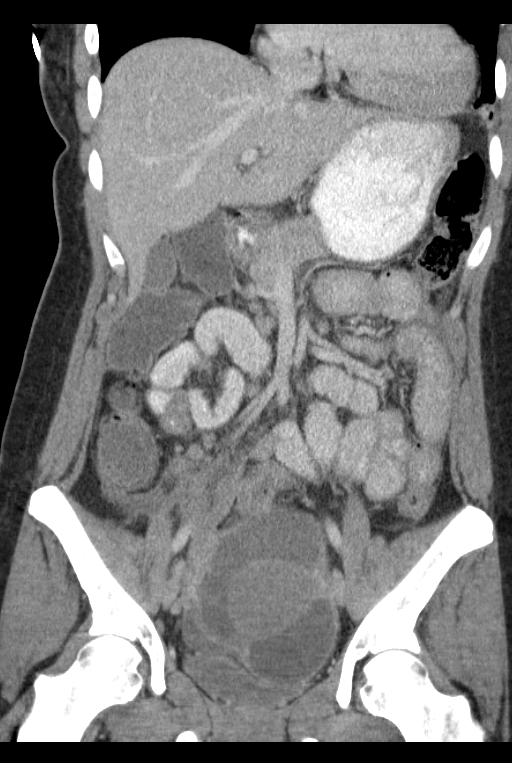
[im 52/93  soft-tissue]
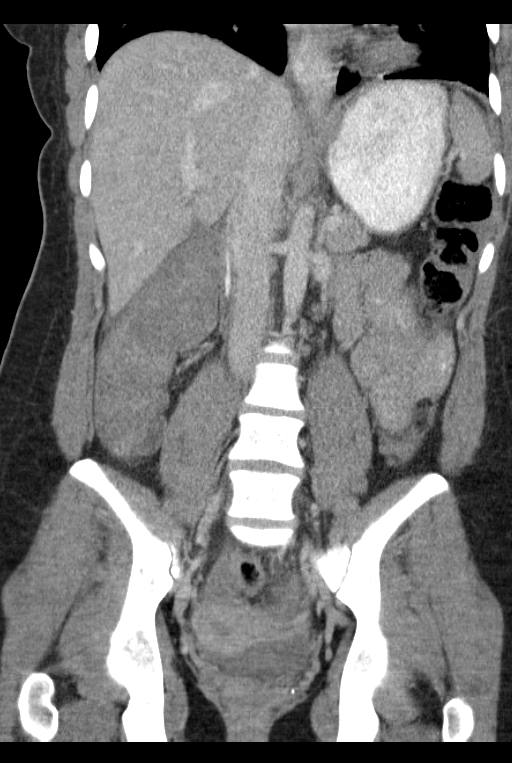

[15 of 46 positions shown; findings below may reference images not displayed]

FINDINGS: Small bilateral pleural effusions are present associated
with bibasilar passive atelectasis.

A small amount of perihepatic ascites is present.  The liver
appears otherwise normal.  The spleen, pancreas, and adrenal glands
appear normal.

The kidneys appear unremarkable, as do the proximal ureters.

Contrast medium noted in the small bowel.  This extends to the
cecum.  There is extensive inflammatory phlegmon in the right lower
quadrant, along with marked wall thickening in the cecum and
adjacent prominent pericecal lymph nodes.

In addition to pelvic ascites, there is a complex cystic lesion
above the urinary bladder measuring 12.7 ml by 9.9 by 10.8 cm.
This lesion contains a more dense 4.8 x 6.2 x 6.4 cm component.  I
am uncertain whether these represent abscess or ovarian lesions;
these lesions are difficult to separate from the left ovary, and
the right ovary is difficult to identify separate from the right
lower quadrant phlegmon.  Right lower quadrant fluid along the
margin of this cyst may also be demonstrating some early
loculation.

The uterine contour appears unremarkable.
IMPRESSION: 1.  Severe inflammatory process in the right lower quadrant,
favoring a ruptured acute appendicitis with severe inflammation of
the adjacent cecum and terminal ileum. The appendix is completely
obscured.  Active Crohn's disease with abscess formation obscuring
the appendix is a differential diagnostic consideration.  There is
ascites in the upper and lower abdomen along with small bilateral
pleural effusions. Surgical assessment is recommended.
2.  New irregular complex large cystic lesions above the urinary
bladder.  Although these could possibly be ovarian in origin, I am
suspicious for pelvic abscesses related to ruptured appendicitis.
Endometriomas or complex ovarian cysts are a less likely
differential diagnostic consideration.

## 2014-09-23 ENCOUNTER — Emergency Department (HOSPITAL_BASED_OUTPATIENT_CLINIC_OR_DEPARTMENT_OTHER)
Admission: EM | Admit: 2014-09-23 | Discharge: 2014-09-23 | Discharge status: Against Medical Advice | Payer: 59 | Attending: Emergency Medicine | Admitting: Emergency Medicine

## 2014-09-23 ENCOUNTER — Encounter (HOSPITAL_BASED_OUTPATIENT_CLINIC_OR_DEPARTMENT_OTHER): Payer: Self-pay | Admitting: Emergency Medicine

## 2014-09-23 DIAGNOSIS — J029 Acute pharyngitis, unspecified: Secondary | ICD-10-CM | POA: Diagnosis not present

## 2014-09-23 DIAGNOSIS — R509 Fever, unspecified: Secondary | ICD-10-CM | POA: Diagnosis not present

## 2014-09-23 DIAGNOSIS — R6884 Jaw pain: Secondary | ICD-10-CM | POA: Diagnosis not present

## 2014-09-23 NOTE — ED Notes (Signed)
Patient states that she called her dentist to make him aware of what is going on, and they want to see her in the office. The patient left prior to being seen by the MD due to going to dentist.

## 2014-09-23 NOTE — ED Notes (Signed)
She had 2 teeth pulled on Wednesday. She has a fever, jaw pain and sore throat.

## 2020-08-11 ENCOUNTER — Emergency Department (HOSPITAL_COMMUNITY): Payer: BC Managed Care – PPO

## 2020-08-11 ENCOUNTER — Encounter (HOSPITAL_COMMUNITY): Payer: Self-pay | Admitting: Emergency Medicine

## 2020-08-11 ENCOUNTER — Other Ambulatory Visit: Payer: Self-pay

## 2020-08-11 ENCOUNTER — Inpatient Hospital Stay (HOSPITAL_COMMUNITY)
Admission: EM | Admit: 2020-08-11 | Discharge: 2020-08-18 | DRG: 177 | Disposition: A | Discharge status: Home / Self Care | Payer: BC Managed Care – PPO | Attending: Internal Medicine | Admitting: Internal Medicine

## 2020-08-11 DIAGNOSIS — J1282 Pneumonia due to coronavirus disease 2019: Secondary | ICD-10-CM | POA: Diagnosis present

## 2020-08-11 DIAGNOSIS — Z882 Allergy status to sulfonamides status: Secondary | ICD-10-CM

## 2020-08-11 DIAGNOSIS — I313 Pericardial effusion (noninflammatory): Secondary | ICD-10-CM | POA: Diagnosis present

## 2020-08-11 DIAGNOSIS — Z283 Underimmunization status: Secondary | ICD-10-CM

## 2020-08-11 DIAGNOSIS — D649 Anemia, unspecified: Secondary | ICD-10-CM | POA: Diagnosis present

## 2020-08-11 DIAGNOSIS — R9431 Abnormal electrocardiogram [ECG] [EKG]: Secondary | ICD-10-CM | POA: Diagnosis not present

## 2020-08-11 DIAGNOSIS — R0602 Shortness of breath: Secondary | ICD-10-CM

## 2020-08-11 DIAGNOSIS — J9601 Acute respiratory failure with hypoxia: Secondary | ICD-10-CM | POA: Diagnosis present

## 2020-08-11 DIAGNOSIS — Z6835 Body mass index (BMI) 35.0-35.9, adult: Secondary | ICD-10-CM

## 2020-08-11 DIAGNOSIS — R5381 Other malaise: Secondary | ICD-10-CM | POA: Diagnosis present

## 2020-08-11 DIAGNOSIS — R739 Hyperglycemia, unspecified: Secondary | ICD-10-CM | POA: Diagnosis not present

## 2020-08-11 DIAGNOSIS — Z79899 Other long term (current) drug therapy: Secondary | ICD-10-CM

## 2020-08-11 DIAGNOSIS — R001 Bradycardia, unspecified: Secondary | ICD-10-CM | POA: Diagnosis not present

## 2020-08-11 DIAGNOSIS — J9600 Acute respiratory failure, unspecified whether with hypoxia or hypercapnia: Secondary | ICD-10-CM | POA: Diagnosis not present

## 2020-08-11 DIAGNOSIS — E669 Obesity, unspecified: Secondary | ICD-10-CM | POA: Diagnosis present

## 2020-08-11 DIAGNOSIS — U071 COVID-19: Secondary | ICD-10-CM | POA: Diagnosis present

## 2020-08-11 LAB — CBC WITH DIFFERENTIAL/PLATELET
Abs Immature Granulocytes: 0.03 10*3/uL (ref 0.00–0.07)
Basophils Absolute: 0 10*3/uL (ref 0.0–0.1)
Basophils Relative: 0 %
Eosinophils Absolute: 0 10*3/uL (ref 0.0–0.5)
Eosinophils Relative: 0 %
HCT: 39 % (ref 36.0–46.0)
Hemoglobin: 12.1 g/dL (ref 12.0–15.0)
Immature Granulocytes: 0 %
Lymphocytes Relative: 12 %
Lymphs Abs: 0.9 10*3/uL (ref 0.7–4.0)
MCH: 22.6 pg — ABNORMAL LOW (ref 26.0–34.0)
MCHC: 31 g/dL (ref 30.0–36.0)
MCV: 72.8 fL — ABNORMAL LOW (ref 80.0–100.0)
Monocytes Absolute: 0.4 10*3/uL (ref 0.1–1.0)
Monocytes Relative: 5 %
Neutro Abs: 6.6 10*3/uL (ref 1.7–7.7)
Neutrophils Relative %: 83 %
Platelets: 265 10*3/uL (ref 150–400)
RBC: 5.36 MIL/uL — ABNORMAL HIGH (ref 3.87–5.11)
RDW: 15.8 % — ABNORMAL HIGH (ref 11.5–15.5)
WBC: 7.9 10*3/uL (ref 4.0–10.5)
nRBC: 0 % (ref 0.0–0.2)

## 2020-08-11 LAB — C-REACTIVE PROTEIN: CRP: 15.4 mg/dL — ABNORMAL HIGH (ref <1.0)

## 2020-08-11 LAB — COMPREHENSIVE METABOLIC PANEL
ALT: 23 U/L (ref 0–44)
AST: 52 U/L — ABNORMAL HIGH (ref 15–41)
Albumin: 3.4 g/dL — ABNORMAL LOW (ref 3.5–5.0)
Alkaline Phosphatase: 39 U/L (ref 38–126)
Anion gap: 13 (ref 5–15)
BUN: 7 mg/dL (ref 6–20)
CO2: 24 mmol/L (ref 22–32)
Calcium: 8.7 mg/dL — ABNORMAL LOW (ref 8.9–10.3)
Chloride: 100 mmol/L (ref 98–111)
Creatinine, Ser: 0.67 mg/dL (ref 0.44–1.00)
GFR calc Af Amer: >60 mL/min (ref >60)
GFR calc non Af Amer: >60 mL/min (ref >60)
Glucose, Bld: 108 mg/dL — ABNORMAL HIGH (ref 70–99)
Potassium: 4.6 mmol/L (ref 3.5–5.1)
Sodium: 137 mmol/L (ref 135–145)
Total Bilirubin: 0.6 mg/dL (ref 0.3–1.2)
Total Protein: 7.8 g/dL (ref 6.5–8.1)

## 2020-08-11 LAB — D-DIMER, QUANTITATIVE: D-Dimer, Quant: 0.72 ug/mL-FEU — ABNORMAL HIGH (ref 0.00–0.50)

## 2020-08-11 LAB — TRIGLYCERIDES: Triglycerides: 143 mg/dL (ref <150)

## 2020-08-11 LAB — LACTIC ACID, PLASMA
Lactic Acid, Venous: 1.2 mmol/L (ref 0.5–1.9)
Lactic Acid, Venous: 0.9 mmol/L (ref 0.5–1.9)

## 2020-08-11 LAB — PROCALCITONIN: Procalcitonin: <0.1 ng/mL

## 2020-08-11 LAB — FERRITIN: Ferritin: 125 ng/mL (ref 11–307)

## 2020-08-11 LAB — I-STAT BETA HCG BLOOD, ED (MC, WL, AP ONLY): I-stat hCG, quantitative: <5 m[IU]/mL (ref <5)

## 2020-08-11 LAB — FIBRINOGEN: Fibrinogen: 611 mg/dL — ABNORMAL HIGH (ref 210–475)

## 2020-08-11 LAB — LACTATE DEHYDROGENASE: LDH: 514 U/L — ABNORMAL HIGH (ref 98–192)

## 2020-08-11 MED ORDER — ONDANSETRON HCL 4 MG PO TABS: 4.0000 mg | ORAL_TABLET | Freq: Four times a day (QID) | ORAL | Status: DC | PRN

## 2020-08-11 MED ORDER — SODIUM CHLORIDE 0.9 % IV SOLN
100.0000 mg | Freq: Every day | INTRAVENOUS | Status: AC
  Administered 2020-08-12 – 2020-08-15 (×4): 100 mg via INTRAVENOUS
  Filled 2020-08-11 (×4): qty 20

## 2020-08-11 MED ORDER — SODIUM CHLORIDE 0.9 % IV SOLN: 200.0000 mg | Freq: Once | INTRAVENOUS | Status: DC

## 2020-08-11 MED ORDER — SODIUM CHLORIDE 0.9% FLUSH
3.0000 mL | Freq: Two times a day (BID) | INTRAVENOUS | Status: DC
  Administered 2020-08-11 – 2020-08-18 (×12): 3 mL via INTRAVENOUS

## 2020-08-11 MED ORDER — DEXAMETHASONE SODIUM PHOSPHATE 10 MG/ML IJ SOLN
10.0000 mg | Freq: Once | INTRAMUSCULAR | Status: AC
  Administered 2020-08-11: 10 mg via INTRAVENOUS
  Filled 2020-08-11: qty 1

## 2020-08-11 MED ORDER — CALCIUM CITRATE-VITAMIN D 250-100 MG-UNIT PO TABS: 1.0000 | ORAL_TABLET | Freq: Two times a day (BID) | ORAL | Status: DC

## 2020-08-11 MED ORDER — FLORA-Q PO CAPS: 1.0000 | ORAL_CAPSULE | Freq: Every day | ORAL | Status: DC

## 2020-08-11 MED ORDER — ENOXAPARIN SODIUM 40 MG/0.4ML ~~LOC~~ SOLN
40.0000 mg | SUBCUTANEOUS | Status: DC
  Administered 2020-08-11: 40 mg via SUBCUTANEOUS
  Filled 2020-08-11: qty 0.4

## 2020-08-11 MED ORDER — SODIUM CHLORIDE 0.9% FLUSH
3.0000 mL | INTRAVENOUS | Status: DC | PRN
  Administered 2020-08-15: 3 mL via INTRAVENOUS

## 2020-08-11 MED ORDER — ACETAMINOPHEN 325 MG PO TABS
650.0000 mg | ORAL_TABLET | Freq: Four times a day (QID) | ORAL | Status: DC | PRN
  Administered 2020-08-11 – 2020-08-15 (×4): 650 mg via ORAL
  Filled 2020-08-11 (×5): qty 2

## 2020-08-11 MED ORDER — SODIUM CHLORIDE 0.9 % IV SOLN: 100.0000 mg | Freq: Every day | INTRAVENOUS | Status: DC

## 2020-08-11 MED ORDER — SODIUM CHLORIDE 0.9 % IV SOLN
200.0000 mg | Freq: Once | INTRAVENOUS | Status: AC
  Administered 2020-08-11: 200 mg via INTRAVENOUS
  Filled 2020-08-11: qty 200

## 2020-08-11 MED ORDER — DEXAMETHASONE SODIUM PHOSPHATE 10 MG/ML IJ SOLN: 6.0000 mg | INTRAMUSCULAR | Status: DC

## 2020-08-11 MED ORDER — FERROUS FUMARATE 324 (106 FE) MG PO TABS
1.0000 | ORAL_TABLET | Freq: Every day | ORAL | Status: DC
  Administered 2020-08-12 – 2020-08-17 (×6): 106 mg via ORAL
  Filled 2020-08-11 (×7): qty 1

## 2020-08-11 MED ORDER — ZINC SULFATE 220 (50 ZN) MG PO CAPS
220.0000 mg | ORAL_CAPSULE | Freq: Every day | ORAL | Status: DC
  Administered 2020-08-11 – 2020-08-13 (×3): 220 mg via ORAL
  Filled 2020-08-11 (×3): qty 1

## 2020-08-11 MED ORDER — FERROUS GLUCONATE 325 (36 FE) MG PO TABS: 1.0000 | ORAL_TABLET | Freq: Every day | ORAL | Status: DC

## 2020-08-11 MED ORDER — TAB-A-VITE/IRON PO TABS
1.0000 | ORAL_TABLET | Freq: Every day | ORAL | Status: DC
  Administered 2020-08-13 – 2020-08-18 (×6): 1 via ORAL
  Filled 2020-08-11 (×8): qty 1

## 2020-08-11 MED ORDER — GUAIFENESIN-DM 100-10 MG/5ML PO SYRP
10.0000 mL | ORAL_SOLUTION | ORAL | Status: DC | PRN
  Administered 2020-08-13: 10 mL via ORAL
  Filled 2020-08-11 (×2): qty 10

## 2020-08-11 MED ORDER — SODIUM CHLORIDE 0.9 % IV SOLN
250.0000 mL | INTRAVENOUS | Status: DC | PRN
  Administered 2020-08-15: 500 mL via INTRAVENOUS

## 2020-08-11 MED ORDER — ASCORBIC ACID 500 MG PO TABS
500.0000 mg | ORAL_TABLET | Freq: Every day | ORAL | Status: DC
  Administered 2020-08-11 – 2020-08-13 (×3): 500 mg via ORAL
  Filled 2020-08-11 (×3): qty 1

## 2020-08-11 MED ORDER — ALBUTEROL SULFATE HFA 108 (90 BASE) MCG/ACT IN AERS
2.0000 | INHALATION_SPRAY | Freq: Four times a day (QID) | RESPIRATORY_TRACT | Status: DC
  Administered 2020-08-11 – 2020-08-14 (×9): 2 via RESPIRATORY_TRACT
  Filled 2020-08-11 (×3): qty 6.7

## 2020-08-11 MED ORDER — CALCIUM CARBONATE-VITAMIN D 500-200 MG-UNIT PO TABS
0.5000 | ORAL_TABLET | Freq: Two times a day (BID) | ORAL | Status: DC
  Administered 2020-08-11 – 2020-08-18 (×14): 0.5 via ORAL
  Filled 2020-08-11 (×11): qty 1
  Filled 2020-08-11: qty 0.5
  Filled 2020-08-11 (×3): qty 1

## 2020-08-11 MED ORDER — DEXAMETHASONE SODIUM PHOSPHATE 10 MG/ML IJ SOLN
6.0000 mg | INTRAMUSCULAR | Status: DC
  Administered 2020-08-12 – 2020-08-14 (×3): 6 mg via INTRAVENOUS
  Filled 2020-08-11 (×4): qty 1

## 2020-08-11 MED ORDER — ONDANSETRON HCL 4 MG/2ML IJ SOLN
4.0000 mg | Freq: Four times a day (QID) | INTRAMUSCULAR | Status: DC | PRN
  Filled 2020-08-11: qty 2

## 2020-08-11 NOTE — ED Notes (Signed)
Phlebotomy will get second lactic acid when the patient reaches the floor.

## 2020-08-11 NOTE — ED Notes (Signed)
Report given. Floor unable to take the patient now. Floor will call when able to receive the patient.

## 2020-08-11 NOTE — Progress Notes (Signed)
Flutter Valve not given due to being on back order.

## 2020-08-11 NOTE — ED Provider Notes (Addendum)
[facility]-EMERGENCY DEPT Provider Note   CSN: [id number] Arrival date & time: 08/11/20  1204     History Chief Complaint  Patient presents with  . Shortness of Breath  . Covid Positive    [name] [name] is a 34 y.o. female.  HPI 34 year old female with a history of appendicits, renal insufficiency, anemia presents to the ER w/ complaints of SHOB. Tested positive for COVID on 9/7, began to have progressive SOB since 9/6. Called EMS today because she felt like she could not get a breath in. States she feels better if she is laying on her stomach. Pt is speaking in short sentences on arrival, tachypneic. No vaccinated. Denies hemoptysis, fevers, chills, abdominal pain, diarrhea, constipation, endorses a dry cough. No CP. EMS found the patient hypoxic on RA at 84%, requiring 4L [place].      Past Medical History:  Diagnosis Date  . Anemia     Patient Active Problem List   Diagnosis Date Noted  . Appendicitis, acute 02/25/2012  . Vaginal bleeding 02/14/2012  . Renal insufficiency 02/14/2012  . Pleural effusion 02/14/2012  . Anemia 02/10/2012  . Ileus following gastrointestinal surgery (HCC) 02/03/2012    Past Surgical History:  Procedure Laterality Date  . APPENDECTOMY    . APPLICATION OF WOUND VAC  01/29/2012   Procedure: APPLICATION OF WOUND VAC;  Surgeon: Robyne Askew, MD;  Location: [facility] ORS;  Service: General;;  . LAPAROSCOPY  01/29/2012   Procedure: LAPAROSCOPY DIAGNOSTIC;  Surgeon: Robyne Askew, MD;  Location: [facility];  Service: General;  Laterality: N/A;  . LAPAROTOMY  01/29/2012   Procedure: EXPLORATORY LAPAROTOMY;  Surgeon: Robyne Askew, MD;  Location: [facility] ORS;  Service: General;  Laterality: N/A;  OPEN RIGHT COLECTOMY ,TERMINAL ILEUM AND APPENDECTOMY     OB History   No obstetric history on file.     No family history on file.  Social History   Tobacco Use  . Smoking status: Never Smoker  . Smokeless tobacco: Never Used  Substance  Use Topics  . Alcohol use: No  . Drug use: No    Home Medications Prior to Admission medications   Medication Sig Start Date End Date Taking? Authorizing Provider  calcium-vitamin D 250-100 MG-UNIT per tablet Take 1 tablet by mouth 2 (two) times daily.    [provider]  ferrous fumarate (HEMOCYTE - 106 MG FE) 325 (106 FE) MG TABS Take 1 tablet (106 mg of iron total) by mouth at bedtime. 02/19/12   Sherrie George, PA-C  Ferrous Gluconate 325 (36 FE) MG TABS Take 1 tablet by mouth daily. 02/19/12   Sherrie George, PA-C  Flora-Q [facility]) CAPS Take 1 capsule by mouth daily. 02/19/12   Sherrie George, PA-C  Multiple Vitamins-Iron (MULTIVITAMINS WITH IRON) TABS Take 1 tablet by mouth daily. 02/19/12   Sherrie George, PA-C    Allergies    Sulfa antibiotics  Review of Systems   Review of Systems  Constitutional: Positive for fatigue. Negative for chills and fever.  HENT: Negative for ear pain and sore throat.   Eyes: Negative for pain and visual disturbance.  Respiratory: Positive for cough and shortness of breath.   Cardiovascular: Negative for chest pain and palpitations.  Gastrointestinal: Negative for abdominal pain and vomiting.  Genitourinary: Negative for dysuria and hematuria.  Musculoskeletal: Negative for arthralgias and back pain.  Skin: Negative for color change and rash.  Neurological: Positive for weakness. Negative for seizures and syncope.  All other systems reviewed and are negative.   Physical Exam Updated Vital Signs BP 130/86 (BP Location: Right Arm)   Pulse 74   Temp 98.8 F (37.1 C) (Oral)   Resp (!) 22   SpO2 96%   Physical Exam Vitals and nursing note reviewed.  Constitutional:      General: She is not in acute distress.    Appearance: She is well-developed. She is obese. She is ill-appearing. She is not toxic-appearing or diaphoretic.  HENT:     Head: Normocephalic and atraumatic.  Eyes:     Conjunctiva/sclera: Conjunctivae  normal.  Cardiovascular:     Rate and Rhythm: Normal rate and regular rhythm.     Heart sounds: No murmur heard.   Pulmonary:     Effort: Tachypnea and accessory muscle usage present. No respiratory distress.     Breath sounds: Decreased breath sounds, wheezing and rhonchi present.  Chest:     Chest wall: No tenderness.  Abdominal:     Palpations: Abdomen is soft.     Tenderness: There is no abdominal tenderness.  Musculoskeletal:        General: Normal range of motion.     Cervical back: Neck supple.     Right lower leg: No edema.     Left lower leg: No edema.  Skin:    General: Skin is warm and dry.  Neurological:     General: No focal deficit present.     Mental Status: She is alert.  Psychiatric:        Mood and Affect: Mood normal.        Behavior: Behavior normal.     ED Results / Procedures / Treatments   Labs (all labs ordered are listed, but only abnormal results are displayed) Labs Reviewed  CBC WITH DIFFERENTIAL/PLATELET - Abnormal; Notable for the following components:      Result Value   RBC 5.36 (*)    MCV 72.8 (*)    MCH 22.6 (*)    RDW 15.8 (*)    All other components within normal limits  D-DIMER, QUANTITATIVE (NOT AT [facility]) - Abnormal; Notable for the following components:   D-Dimer, Quant 0.72 (*)    All other components within normal limits  FIBRINOGEN - Abnormal; Notable for the following components:   Fibrinogen 611 (*)    All other components within normal limits  CULTURE, BLOOD (ROUTINE X 2)  CULTURE, BLOOD (ROUTINE X 2)  LACTIC ACID, PLASMA  LACTIC ACID, PLASMA  COMPREHENSIVE METABOLIC PANEL  PROCALCITONIN  LACTATE DEHYDROGENASE  FERRITIN  TRIGLYCERIDES  C-REACTIVE PROTEIN  I-STAT BETA HCG BLOOD, ED ([facility], [facility], [facility] ONLY)    EKG EKG Interpretation  Date/Time:  Friday August 11 2020 12:59:04 EDT Ventricular Rate:  72 PR Interval:    QRS Duration: 93 QT Interval:  377 QTC Calculation: 413 R Axis:   38 Text Interpretation: Sinus  rhythm Nonspecific T abnormalities, diffuse leads Confirmed by Raeford Razor [phone]) on 08/11/2020 1:42:21 PM   Radiology DG Chest Port 1 View  Result Date: 08/11/2020 CLINICAL DATA:  Shortness of breath.  COVID-19 positive EXAM: PORTABLE CHEST 1 VIEW COMPARISON:  January 24, 2012 FINDINGS: There is airspace opacity in both mid and lower lung regions. Heart is upper normal in size with pulmonary vascularity normal. No adenopathy. No bone lesions. IMPRESSION: Airspace opacity in both mid and lower lung regions consistent with multifocal pneumonia. Atypical organism pneumonia most likely given this appearance and clinical history. Heart upper normal in size.  No adenopathy demonstrable. Electronically Signed   By: Bretta Bang III M.D.   On: 08/11/2020 13:06    Procedures Procedures (including critical care time)  Medications Ordered in ED Medications  remdesivir 200 mg in sodium chloride 0.9% 250 mL IVPB (has no administration in time range)    Followed by  remdesivir 100 mg in sodium chloride 0.9 % 100 mL IVPB (has no administration in time range)  dexamethasone (DECADRON) injection 10 mg (10 mg Intravenous Given 08/11/20 1252)    ED Course  I have reviewed the triage vital signs and the nursing notes.  Pertinent labs & imaging results that were available during my care of the patient were reviewed by me and considered in my medical decision making (see chart for details).    MDM Rules/Calculators/A&P                         34 year old female who tested positive for Covid on 9/7 here with complaints of shortness of breath, found hypoxic on room air at 84% On presentation, she is on 4 L nasal cannula, tachypneic, speaking in short sentences, O2 sats at 96% on supplemental O2.  Lung sounds diminished, with rhonchi throughout.  Abdomen soft and nontender.  CBC without leukocytosis, chest x-ray with bilateral multifocal atypical pneumonia.  Patient was given Decadron and remdesivir.   Given patient's new O2 requirement, spoke with Dr. Joylene Igo with  the hospitalist team who will admit the patient for further evaluation and treatment.  Remains hemodynamically stable here in the ED.  Final Clinical Impression(s) / ED Diagnoses Final diagnoses:  Acute hypoxemic respiratory failure due to COVID-19 [facility])    Rx / DC Orders ED Discharge Orders    None           Mare Ferrari, PA-C 08/11/20 1400    Raeford Razor, MD 08/12/20 1624

## 2020-08-11 NOTE — Plan of Care (Signed)

## 2020-08-11 NOTE — Progress Notes (Signed)
Pt arrived to floor via stretcher, able to ambulate to bed but became very sob with minimal exertion, placed on 5 liters nasal cannula, cont pulse ox applied, tele monitor applied, instructed to try and take slower breaths through nose and out the mouth, o2 sats at 83% prior, after 15 minutes sats between 88-90%, although noted that pt becomes winded with talking as well, yellow mews triggered (was red/yellow in ED), protocol followed, provider notified via amion, pt feels better now after period of rest, will continue to monitor for further interventions.

## 2020-08-11 NOTE — ED Notes (Signed)
[name] [name], father, would like an update on his daughter, [phone].

## 2020-08-11 NOTE — H&P (Signed)
History and Physical    [name] [name] EPP:[id number] DOB: 07-22-1986 DOA: 08/11/2020  PCP: Burnis Medin, PA-C   Patient coming from: Home  I have personally briefly reviewed patient's old medical records in [facility]  Chief Complaint: Shortness of breath  HPI: [name] [name] is a 34 y.o. female with no significant medical history who was brought into the emergency room by EMS for evaluation of shortness of breath.  Patient states she has had symptoms for about 4 days but tested positive for the COVID-19 virus on 08/08/20.  She presents to the ER for evaluation of worsening shortness of breath and a nonproductive cough and was found to have a room air pulse oximetry of 84%.  This improved following oxygen supplementation at 4 L to greater than 92%. She has had fever and chills, myalgias, anorexia and generalized fatigue. She denies having any abdominal pain, no changes in her bowel habits, no urinary symptoms. Labs show sodium 137, potassium 4.6, chloride 100, bicarb 24, BUN 7, creatinine 0.67, calcium 8.7, alkaline phosphatase 39, albumin 3.4, AST 52, ALT 23, total protein 7.8, LDH 514, ferritin 125, CRP 15.4, lactic acid 1.2, white count 7.9, hemoglobin 12.1, hematocrit 39, MCV 72, RDW 15.8, platelet count 265, D-dimer 0.72, fibrinogen 611 Chest x-ray reviewed by me shows airspace opacity in both mid and lower lung regions consistent with multifocal pneumonia. Twelve-lead EKG reviewed by me shows sinus rhythm with nonspecific T wave abnormalities. Patient is unvaccinated   ED Course: Patient is a 34 year old female who is unvaccinated for the COVID-19 virus and who presents to the ER for evaluation of worsening shortness of breath and a nonproductive cough.  She tested positive for the COVID-19 virus on 08/08/20.  Chest x-ray shows multifocal pneumonia. She was hypoxic in the field with pulse oximetry of 84% on room air and is currently on 4 L of oxygen via nasal cannula to  maintain pulse oximetry greater than 92%. Patient received a dose of remdesivir and Decadron in the ER.  Review of Systems: As per HPI otherwise 10 point review of systems negative.    Past Medical History:  Diagnosis Date  . Anemia     Past Surgical History:  Procedure Laterality Date  . APPENDECTOMY    . APPLICATION OF WOUND VAC  01/29/2012   Procedure: APPLICATION OF WOUND VAC;  Surgeon: Robyne Askew, MD;  Location: [facility];  Service: General;;  . LAPAROSCOPY  01/29/2012   Procedure: LAPAROSCOPY DIAGNOSTIC;  Surgeon: Robyne Askew, MD;  Location: [facility];  Service: General;  Laterality: N/A;  . LAPAROTOMY  01/29/2012   Procedure: EXPLORATORY LAPAROTOMY;  Surgeon: Robyne Askew, MD;  Location: [facility];  Service: General;  Laterality: N/A;  OPEN RIGHT COLECTOMY ,TERMINAL ILEUM AND APPENDECTOMY     reports that she has never smoked. She has never used smokeless tobacco. She reports that she does not drink alcohol and does not use drugs.  Allergies  Allergen Reactions  . Sulfa Antibiotics Hives    History reviewed. No pertinent family history.   Prior to Admission medications   Medication Sig Start Date End Date Taking? Authorizing Provider  calcium-vitamin D 250-100 MG-UNIT per tablet Take 1 tablet by mouth 2 (two) times daily.    [provider]  ferrous fumarate (HEMOCYTE - 106 MG FE) 325 (106 FE) MG TABS Take 1 tablet (106 mg of iron total) by mouth at bedtime. 02/19/12   Sherrie George, PA-C  Ferrous Gluconate 325 (  36 FE) MG TABS Take 1 tablet by mouth daily. 02/19/12   Sherrie George, PA-C  Flora-Q [facility]) CAPS Take 1 capsule by mouth daily. 02/19/12   Sherrie George, PA-C  Multiple Vitamins-Iron (MULTIVITAMINS WITH IRON) TABS Take 1 tablet by mouth daily. 02/19/12   Sherrie George, PA-C    Physical Exam: Vitals:   08/11/20 1221 08/11/20 1255  BP: 130/86   Pulse: 74   Resp: (!) 22   Temp:  98.8 F (37.1 C)  TempSrc:  Oral  SpO2: 96%       Vitals:   08/11/20 1221 08/11/20 1255  BP: 130/86   Pulse: 74   Resp: (!) 22   Temp:  98.8 F (37.1 C)  TempSrc:  Oral  SpO2: 96%     Constitutional: NAD, alert and oriented x 3.  Acutely ill-appearing Eyes: PERRL, lids and conjunctivae pallor ENMT: Mucous membranes are moist.  Neck: normal, supple, no masses, no thyromegaly Respiratory: Air movement in all lung fields, no wheezing, no crackles.  Increased work of breathing with respiratory rate in the 30's Cardiovascular: Regular rate and rhythm, no murmurs / rubs / gallops. No extremity edema. 2+ pedal pulses. No carotid bruits.  Abdomen: no tenderness, no masses palpated. No hepatosplenomegaly. Bowel sounds positive.  Musculoskeletal: no clubbing / cyanosis. No joint deformity upper and lower extremities.  Skin: no rashes, lesions, ulcers.  Neurologic: No gross focal neurologic deficit. Psychiatric: Normal mood and affect.   Labs on Admission: I have personally reviewed following labs and imaging studies  CBC: Recent Labs  Lab 08/11/20 1249  WBC 7.9  NEUTROABS 6.6  HGB 12.1  HCT 39.0  MCV 72.8*  PLT 265   Basic Metabolic Panel: Recent Labs  Lab 08/11/20 1249  NA 137  K 4.6  CL 100  CO2 24  GLUCOSE 108*  BUN 7  CREATININE 0.67  CALCIUM 8.7*   GFR: CrCl cannot be calculated (Unknown ideal weight.). Liver Function Tests: Recent Labs  Lab 08/11/20 1249  AST 52*  ALT 23  ALKPHOS 39  BILITOT 0.6  PROT 7.8  ALBUMIN 3.4*   No results for input(s): LIPASE, AMYLASE in the last 168 hours. No results for input(s): AMMONIA in the last 168 hours. Coagulation Profile: No results for input(s): INR, PROTIME in the last 168 hours. Cardiac Enzymes: No results for input(s): CKTOTAL, CKMB, CKMBINDEX, TROPONINI in the last 168 hours. BNP (last 3 results) No results for input(s): PROBNP in the last 8760 hours. HbA1C: No results for input(s): HGBA1C in the last 72 hours. CBG: No results for input(s):  GLUCAP in the last 168 hours. Lipid Profile: No results for input(s): CHOL, HDL, LDLCALC, TRIG, CHOLHDL, LDLDIRECT in the last 72 hours. Thyroid Function Tests: No results for input(s): TSH, T4TOTAL, FREET4, T3FREE, THYROIDAB in the last 72 hours. Anemia Panel: No results for input(s): VITAMINB12, FOLATE, FERRITIN, TIBC, IRON, RETICCTPCT in the last 72 hours. Urine analysis:    Component Value Date/Time   COLORURINE YELLOW 03/01/2010 1509   APPEARANCEUR CLEAR 03/01/2010 1509   LABSPEC 1.017 03/01/2010 1509   PHURINE 5.5 03/01/2010 1509   GLUCOSEU NEGATIVE 03/01/2010 1509   HGBUR LARGE (A) 03/01/2010 1509   BILIRUBINUR NEGATIVE 03/01/2010 1509   KETONESUR 15 (A) 03/01/2010 1509   PROTEINUR NEGATIVE 03/01/2010 1509   UROBILINOGEN 0.2 03/01/2010 1509   NITRITE NEGATIVE 03/01/2010 1509   LEUKOCYTESUR NEGATIVE 03/01/2010 1509    Radiological Exams on Admission: DG Chest Port 1 View  Result Date: 08/11/2020 CLINICAL DATA:  Shortness of breath.  COVID-19 positive EXAM: PORTABLE CHEST 1 VIEW COMPARISON:  January 24, 2012 FINDINGS: There is airspace opacity in both mid and lower lung regions. Heart is upper normal in size with pulmonary vascularity normal. No adenopathy. No bone lesions. IMPRESSION: Airspace opacity in both mid and lower lung regions consistent with multifocal pneumonia. Atypical organism pneumonia most likely given this appearance and clinical history. Heart upper normal in size.  No adenopathy demonstrable. Electronically Signed   By: Bretta Bang III M.D.   On: 08/11/2020 13:06    EKG: Independently reviewed.  Normal sinus rhythm  Assessment/Plan Principal Problem:   Pneumonia due to COVID-19 virus Active Problems:   Acute respiratory failure due to COVID-19 [facility])    Acute respiratory failure due to COVID-19 pneumonia Patient presents to the ER for evaluation of worsening shortness of breath associated with nonproductive cough She is unvaccinated and tested  positive for the COVID-19 virus on 08/08/20 She was hypoxic in the field with room air pulse oximetry of 84% She is tachypneic with respiratory rate in the 30s and is currently on 4 L of oxygen to maintain pulse oximetry greater than 92%  Place patient on remdesivir per protocol Place patient on Decadron 6 mg IV daily  DVT prophylaxis: Lovenox Code Status: Full code Family Communication: Greater than 50% of time was spent discussing plan of care with patient at the bedside.  All questions and concerns have been addressed.  She verbalizes understanding and agrees with the plan. Disposition Plan: Back to previous home environment Consults called: None    Kendy Haston MD [facility]     08/11/2020, 1:51 PM

## 2020-08-11 NOTE — ED Notes (Signed)
Attempted to call report. Floor RN's in a rapid now. Will call back.

## 2020-08-12 DIAGNOSIS — E669 Obesity, unspecified: Secondary | ICD-10-CM | POA: Diagnosis present

## 2020-08-12 LAB — COMPREHENSIVE METABOLIC PANEL
ALT: 28 U/L (ref 0–44)
AST: 46 U/L — ABNORMAL HIGH (ref 15–41)
Albumin: 3.2 g/dL — ABNORMAL LOW (ref 3.5–5.0)
Alkaline Phosphatase: 43 U/L (ref 38–126)
Anion gap: 14 (ref 5–15)
BUN: 9 mg/dL (ref 6–20)
CO2: 25 mmol/L (ref 22–32)
Calcium: 9.1 mg/dL (ref 8.9–10.3)
Chloride: 101 mmol/L (ref 98–111)
Creatinine, Ser: 0.55 mg/dL (ref 0.44–1.00)
GFR calc Af Amer: >60 mL/min (ref >60)
GFR calc non Af Amer: >60 mL/min (ref >60)
Glucose, Bld: 136 mg/dL — ABNORMAL HIGH (ref 70–99)
Potassium: 3.8 mmol/L (ref 3.5–5.1)
Sodium: 140 mmol/L (ref 135–145)
Total Bilirubin: 0.7 mg/dL (ref 0.3–1.2)
Total Protein: 7.6 g/dL (ref 6.5–8.1)

## 2020-08-12 LAB — CBC WITH DIFFERENTIAL/PLATELET
Abs Immature Granulocytes: 0.04 10*3/uL (ref 0.00–0.07)
Basophils Absolute: 0 10*3/uL (ref 0.0–0.1)
Basophils Relative: 0 %
Eosinophils Absolute: 0 10*3/uL (ref 0.0–0.5)
Eosinophils Relative: 0 %
HCT: 37 % (ref 36.0–46.0)
Hemoglobin: 11.3 g/dL — ABNORMAL LOW (ref 12.0–15.0)
Immature Granulocytes: 1 %
Lymphocytes Relative: 13 %
Lymphs Abs: 0.8 10*3/uL (ref 0.7–4.0)
MCH: 22.2 pg — ABNORMAL LOW (ref 26.0–34.0)
MCHC: 30.5 g/dL (ref 30.0–36.0)
MCV: 72.5 fL — ABNORMAL LOW (ref 80.0–100.0)
Monocytes Absolute: 0.4 10*3/uL (ref 0.1–1.0)
Monocytes Relative: 7 %
Neutro Abs: 4.7 10*3/uL (ref 1.7–7.7)
Neutrophils Relative %: 79 %
Platelets: 295 10*3/uL (ref 150–400)
RBC: 5.1 MIL/uL (ref 3.87–5.11)
RDW: 15.6 % — ABNORMAL HIGH (ref 11.5–15.5)
WBC: 5.9 10*3/uL (ref 4.0–10.5)
nRBC: 0 % (ref 0.0–0.2)

## 2020-08-12 LAB — MAGNESIUM: Magnesium: 2.2 mg/dL (ref 1.7–2.4)

## 2020-08-12 LAB — FERRITIN: Ferritin: 114 ng/mL (ref 11–307)

## 2020-08-12 LAB — D-DIMER, QUANTITATIVE: D-Dimer, Quant: 0.59 ug/mL-FEU — ABNORMAL HIGH (ref 0.00–0.50)

## 2020-08-12 LAB — HIV ANTIBODY (ROUTINE TESTING W REFLEX): HIV Screen 4th Generation wRfx: NONREACTIVE

## 2020-08-12 LAB — C-REACTIVE PROTEIN: CRP: 17 mg/dL — ABNORMAL HIGH (ref <1.0)

## 2020-08-12 LAB — PHOSPHORUS: Phosphorus: 3.1 mg/dL (ref 2.5–4.6)

## 2020-08-12 MED ORDER — ENOXAPARIN SODIUM 60 MG/0.6ML ~~LOC~~ SOLN
60.0000 mg | SUBCUTANEOUS | Status: DC
  Administered 2020-08-12 – 2020-08-17 (×6): 60 mg via SUBCUTANEOUS
  Filled 2020-08-12 (×7): qty 0.6

## 2020-08-12 NOTE — Progress Notes (Signed)
PROGRESS NOTE  [name] [name] ZOX:[id number] DOB: [birth year] DOA: 08/11/2020 PCP: Burnis Medin, PA-C  HPI/Recap of past 56 hours: 34 year old female with past medical history of morbid obesity who started having symptoms of cough and mild shortness of breath starting on 9/6, but then tested positive for Covid on 9/7 and then came into the emergency room on 9/10 for worsening shortness of breath.  The emergency room, she was found to have an oxygen saturation of 84%.  She required 4 L nasal cannula.  Initial lab work noteworthy for CRP of 15, lactic acid level of only 1.2 and chest x-ray noting bilateral mid and lower lung opacities consistent with multifocal pneumonia.  Patient is unvaccinated.  Admitted to the hospitalist service.  Started on IV Decadron and Remdisivir.  By following day, patient about the same.  Since then, oxygen increased to 5 L.  CRP increased to 17 this morning.  Patient encouraged to be in the prone position which has helped somewhat.  Denies any pain, just feels very tired.  Assessment/Plan: Principal Problem:   Acute respiratory failure with hypoxia due to COVID-19 pneumonia [facility]): About the same.  Continue Remdisivir and steroids.  If CRP worsens or she requires more oxygen by tomorrow, will consider baricitinib.  Continue oxygen and recommending prone position.    Obesity (BMI 30-39.9): Patient meets criteria BMI greater than 30.   Code Status: Full code  Family Communication: Updated father by phone  Disposition Plan: Anticipate discharge next week once able to be weaned down of oxygen.   Consultants:  None  Procedures:  None  Antimicrobials:  IV Remdisivir 9/10-9/14  DVT prophylaxis: Lovenox*   Objective: Vitals:   08/12/20 1000 08/12/20 1400  BP: 111/78 132/75  Pulse: 62 60  Resp:    Temp: 98.8 F (37.1 C) 99 F (37.2 C)  SpO2: (!) 86% 91%    Intake/Output Summary (Last 24 hours) at 08/12/2020 1620 Last data filed at  08/12/2020 0600 Gross per 24 hour  Intake 120 ml  Output 600 ml  Net -480 ml   Filed Weights   08/11/20 2022  Weight: 116.6 kg   Body mass index is 35.84 kg/m.  Exam:   General: Alert and oriented x3, no acute distress  HEENT: Normocephalic and atraumatic, mucous membranes are slightly dry  Neck: Thick, narrow airway  Cardiovascular: Regular rate and rhythm, borderline tachycardia  Respiratory: Decreased breath sounds throughout part due to body habitus, some fine rales  Abdomen: Soft, nontender, nondistended, positive bowel sounds  Musculoskeletal: No clubbing or cyanosis, trace pitting edema  Skin: No skin breaks, tears or lesions  Psychiatry: Appropriate, no evidence of psychoses  Neuro: No focal deficits   Data Reviewed: CBC: Recent Labs  Lab 08/11/20 1249 08/12/20 0450  WBC 7.9 5.9  NEUTROABS 6.6 4.7  HGB 12.1 11.3*  HCT 39.0 37.0  MCV 72.8* 72.5*  PLT 265 295   Basic Metabolic Panel: Recent Labs  Lab 08/11/20 1249 08/12/20 0450  NA 137 140  K 4.6 3.8  CL 100 101  CO2 24 25  GLUCOSE 108* 136*  BUN 7 9  CREATININE 0.67 0.55  CALCIUM 8.7* 9.1  MG  --  2.2  PHOS  --  3.1   GFR: Estimated Creatinine Clearance: 139.4 mL/min (by C-G formula based on SCr of 0.55 mg/dL). Liver Function Tests: Recent Labs  Lab 08/11/20 1249 08/12/20 0450  AST 52* 46*  ALT 23 28  ALKPHOS 39 43  BILITOT 0.6 0.7  PROT  7.8 7.6  ALBUMIN 3.4* 3.2*   No results for input(s): LIPASE, AMYLASE in the last 168 hours. No results for input(s): AMMONIA in the last 168 hours. Coagulation Profile: No results for input(s): INR, PROTIME in the last 168 hours. Cardiac Enzymes: No results for input(s): CKTOTAL, CKMB, CKMBINDEX, TROPONINI in the last 168 hours. BNP (last 3 results) No results for input(s): PROBNP in the last 8760 hours. HbA1C: No results for input(s): HGBA1C in the last 72 hours. CBG: No results for input(s): GLUCAP in the last 168 hours. Lipid  Profile: Recent Labs    08/11/20 1249  TRIG 143   Thyroid Function Tests: No results for input(s): TSH, T4TOTAL, FREET4, T3FREE, THYROIDAB in the last 72 hours. Anemia Panel: Recent Labs    08/11/20 1249 08/12/20 0450  FERRITIN 125 114   Urine analysis:    Component Value Date/Time   COLORURINE YELLOW 03/01/2010 1509   APPEARANCEUR CLEAR 03/01/2010 1509   LABSPEC 1.017 03/01/2010 1509   PHURINE 5.5 03/01/2010 1509   GLUCOSEU NEGATIVE 03/01/2010 1509   HGBUR LARGE (A) 03/01/2010 1509   BILIRUBINUR NEGATIVE 03/01/2010 1509   KETONESUR 15 (A) 03/01/2010 1509   PROTEINUR NEGATIVE 03/01/2010 1509   UROBILINOGEN 0.2 03/01/2010 1509   NITRITE NEGATIVE 03/01/2010 1509   LEUKOCYTESUR NEGATIVE 03/01/2010 1509   Sepsis Labs: @LABRCNTIP (procalcitonin:4,lacticidven:4)  ) Recent Results (from the past 240 hour(s))  Blood Culture (routine x 2)     Status: None (Preliminary result)   Collection Time: 08/11/20 12:49 PM   Specimen: BLOOD  Result Value Ref Range Status   Specimen Description   Final    BLOOD SITE NOT SPECIFIED Performed at [facility], [address], [place], [place]    Special Requests   Final    BOTTLES DRAWN AEROBIC AND ANAEROBIC Blood Culture adequate volume Performed at [facility], [address] [place], [place]    Culture   Final    NO GROWTH < 24 HOURS Performed at [facility], [address], [place], [place]    Report Status PENDING  Incomplete      Studies: No results found.  Scheduled Meds: . albuterol  2 puff Inhalation Q6H  . vitamin C  500 mg Oral Daily  . calcium-vitamin D  0.5 tablet Oral BID  . dexamethasone (DECADRON) injection  6 mg Intravenous Q24H  . enoxaparin (LOVENOX) injection  60 mg Subcutaneous Q24H  . Ferrous Fumarate  1 tablet Oral QHS  . multivitamins with iron  1 tablet Oral Daily  . sodium chloride flush  3 mL Intravenous Q12H  . zinc  sulfate  220 mg Oral Daily    Continuous Infusions: . sodium chloride    . remdesivir 100 mg in NS 100 mL 100 mg (08/12/20 1211)     LOS: 1 day     10/12/20, MD [facility]   08/12/2020, 4:20 PM

## 2020-08-12 NOTE — Progress Notes (Signed)
Pt has been successful at self proning today for 3 hours at atime. pts oxygen sats decreased into the 60's when up to [facility] and took 2-3 minutes to recover. Pt continues to desat into the low80's with any movement and her work of breathing increases as well. When pts prones her sats are in the 90's. Updated pts father via phone. [name] [name]

## 2020-08-12 NOTE — Progress Notes (Signed)
Father, Kevin Fenton, called in for update. Was very appreciative. Voiced that he would like a phone call from the doctor whenever morning rounds are made just to touch base about his daughters plan of care.

## 2020-08-13 LAB — COMPREHENSIVE METABOLIC PANEL
ALT: 37 U/L (ref 0–44)
AST: 48 U/L — ABNORMAL HIGH (ref 15–41)
Albumin: 3.2 g/dL — ABNORMAL LOW (ref 3.5–5.0)
Alkaline Phosphatase: 42 U/L (ref 38–126)
Anion gap: 13 (ref 5–15)
BUN: 13 mg/dL (ref 6–20)
CO2: 26 mmol/L (ref 22–32)
Calcium: 9.4 mg/dL (ref 8.9–10.3)
Chloride: 100 mmol/L (ref 98–111)
Creatinine, Ser: 0.65 mg/dL (ref 0.44–1.00)
GFR calc Af Amer: >60 mL/min (ref >60)
GFR calc non Af Amer: >60 mL/min (ref >60)
Glucose, Bld: 124 mg/dL — ABNORMAL HIGH (ref 70–99)
Potassium: 3.7 mmol/L (ref 3.5–5.1)
Sodium: 139 mmol/L (ref 135–145)
Total Bilirubin: 0.4 mg/dL (ref 0.3–1.2)
Total Protein: 7.8 g/dL (ref 6.5–8.1)

## 2020-08-13 LAB — CBC WITH DIFFERENTIAL/PLATELET
Abs Immature Granulocytes: 0.07 10*3/uL (ref 0.00–0.07)
Basophils Absolute: 0 10*3/uL (ref 0.0–0.1)
Basophils Relative: 0 %
Eosinophils Absolute: 0 10*3/uL (ref 0.0–0.5)
Eosinophils Relative: 0 %
HCT: 36.9 % (ref 36.0–46.0)
Hemoglobin: 11.2 g/dL — ABNORMAL LOW (ref 12.0–15.0)
Immature Granulocytes: 1 %
Lymphocytes Relative: 13 %
Lymphs Abs: 1.1 10*3/uL (ref 0.7–4.0)
MCH: 22.1 pg — ABNORMAL LOW (ref 26.0–34.0)
MCHC: 30.4 g/dL (ref 30.0–36.0)
MCV: 72.9 fL — ABNORMAL LOW (ref 80.0–100.0)
Monocytes Absolute: 0.9 10*3/uL (ref 0.1–1.0)
Monocytes Relative: 11 %
Neutro Abs: 6.5 10*3/uL (ref 1.7–7.7)
Neutrophils Relative %: 75 %
Platelets: 362 10*3/uL (ref 150–400)
RBC: 5.06 MIL/uL (ref 3.87–5.11)
RDW: 16 % — ABNORMAL HIGH (ref 11.5–15.5)
WBC: 8.6 10*3/uL (ref 4.0–10.5)
nRBC: 0 % (ref 0.0–0.2)

## 2020-08-13 LAB — FERRITIN: Ferritin: 111 ng/mL (ref 11–307)

## 2020-08-13 LAB — PHOSPHORUS: Phosphorus: 2.9 mg/dL (ref 2.5–4.6)

## 2020-08-13 LAB — D-DIMER, QUANTITATIVE: D-Dimer, Quant: 0.5 ug/mL-FEU (ref 0.00–0.50)

## 2020-08-13 LAB — C-REACTIVE PROTEIN: CRP: 7.1 mg/dL — ABNORMAL HIGH (ref <1.0)

## 2020-08-13 LAB — MAGNESIUM: Magnesium: 2.2 mg/dL (ref 1.7–2.4)

## 2020-08-13 NOTE — Progress Notes (Signed)
PROGRESS NOTE  [name] [name] ZTI:[id number] DOB: 04-03-1986 DOA: 08/11/2020 PCP: Burnis Medin, PA-C  HPI/Recap of past 71 hours: 34 year old female with past medical history of morbid obesity who started having symptoms of cough and mild shortness of breath starting on 9/6, but then tested positive for Covid on 9/7 and then came into the emergency room on 9/10 for worsening shortness of breath.  The emergency room, she was found to have an oxygen saturation of 84%.  She required 4 L nasal cannula.  Initial lab work noteworthy for CRP of 15, lactic acid level of only 1.2 and chest x-ray noting bilateral mid and lower lung opacities consistent with multifocal pneumonia.  Patient is unvaccinated.  Admitted to the hospitalist service.  Started on IV Decadron and Remdisivir.  By following day, patient about the same.  Since then, oxygen increased to 5 L.  CRP increased to 17 by morning of 9/11 patient encouraged to be in the prone position which has helped somewhat.    No events overnight.  Patient is still quite fatigued although CRP dropped to 7 today.  She states she feels a little bit better although not much.  Assessment/Plan: Principal Problem:   Acute respiratory failure with hypoxia due to COVID-19 pneumonia [facility]): About the same.  Continue Remdisivir and steroids.  Encouraged by CRP dropped today.  If CRP worsens or she requires more oxygen by tomorrow, will consider baricitinib.  Continue oxygen and recommending prone position.    Obesity (BMI 30-39.9): Patient meets criteria BMI greater than 30.   Code Status: Full code  Family Communication: Updated father by phone  Disposition Plan: Anticipate discharge later this week once able to be weaned down of oxygen.   Consultants:  None  Procedures:  None  Antimicrobials:  IV Remdisivir 9/10-9/14  DVT prophylaxis: Lovenox*   Objective: Vitals:   08/13/20 1003 08/13/20 1420  BP: 101/67 114/83  Pulse: (!) 54 60   Resp: (!) 22 (!) 21  Temp: 97.6 F (36.4 C) (!) 97.5 F (36.4 C)  SpO2: 94% 98%    Intake/Output Summary (Last 24 hours) at 08/13/2020 1450 Last data filed at 08/13/2020 1300 Gross per 24 hour  Intake 1090 ml  Output --  Net 1090 ml   Filed Weights   08/11/20 2022  Weight: 116.6 kg   Body mass index is 35.84 kg/m.  Exam:   General: Alert and oriented x3, no acute distress, fatigued  HEENT: Normocephalic and atraumatic, mucous membranes are slightly dry  Neck: Thick, narrow airway  Cardiovascular: Regular rate and rhythm  Respiratory: Decreased breath sounds throughout part due to body habitus, some fine rales  Abdomen: Soft, nontender, nondistended, positive bowel sounds  Musculoskeletal: No clubbing or cyanosis, trace pitting edema  Skin: No skin breaks, tears or lesions  Psychiatry: Appropriate, no evidence of psychoses  Neuro: No focal deficits   Data Reviewed: CBC: Recent Labs  Lab 08/11/20 1249 08/12/20 0450 08/13/20 0502  WBC 7.9 5.9 8.6  NEUTROABS 6.6 4.7 6.5  HGB 12.1 11.3* 11.2*  HCT 39.0 37.0 36.9  MCV 72.8* 72.5* 72.9*  PLT 265 295 362   Basic Metabolic Panel: Recent Labs  Lab 08/11/20 1249 08/12/20 0450 08/13/20 1142  NA 137 140 139  K 4.6 3.8 3.7  CL 100 101 100  CO2 24 25 26   GLUCOSE 108* 136* 124*  BUN 7 9 13   CREATININE 0.67 0.55 0.65  CALCIUM 8.7* 9.1 9.4  MG  --  2.2 2.2  PHOS  --  3.1 2.9   GFR: Estimated Creatinine Clearance: 139.4 mL/min (by C-G formula based on SCr of 0.65 mg/dL). Liver Function Tests: Recent Labs  Lab 08/11/20 1249 08/12/20 0450 08/13/20 1142  AST 52* 46* 48*  ALT 23 28 37  ALKPHOS 39 43 42  BILITOT 0.6 0.7 0.4  PROT 7.8 7.6 7.8  ALBUMIN 3.4* 3.2* 3.2*   No results for input(s): LIPASE, AMYLASE in the last 168 hours. No results for input(s): AMMONIA in the last 168 hours. Coagulation Profile: No results for input(s): INR, PROTIME in the last 168 hours. Cardiac Enzymes: No results  for input(s): CKTOTAL, CKMB, CKMBINDEX, TROPONINI in the last 168 hours. BNP (last 3 results) No results for input(s): PROBNP in the last 8760 hours. HbA1C: No results for input(s): HGBA1C in the last 72 hours. CBG: No results for input(s): GLUCAP in the last 168 hours. Lipid Profile: Recent Labs    08/11/20 1249  TRIG 143   Thyroid Function Tests: No results for input(s): TSH, T4TOTAL, FREET4, T3FREE, THYROIDAB in the last 72 hours. Anemia Panel: Recent Labs    08/12/20 0450 08/13/20 1142  FERRITIN 114 111   Urine analysis:    Component Value Date/Time   COLORURINE YELLOW 03/01/2010 1509   APPEARANCEUR CLEAR 03/01/2010 1509   LABSPEC 1.017 03/01/2010 1509   PHURINE 5.5 03/01/2010 1509   GLUCOSEU NEGATIVE 03/01/2010 1509   HGBUR LARGE (A) 03/01/2010 1509   BILIRUBINUR NEGATIVE 03/01/2010 1509   KETONESUR 15 (A) 03/01/2010 1509   PROTEINUR NEGATIVE 03/01/2010 1509   UROBILINOGEN 0.2 03/01/2010 1509   NITRITE NEGATIVE 03/01/2010 1509   LEUKOCYTESUR NEGATIVE 03/01/2010 1509   Sepsis Labs: @LABRCNTIP (procalcitonin:4,lacticidven:4)  ) Recent Results (from the past 240 hour(s))  Blood Culture (routine x 2)     Status: None (Preliminary result)   Collection Time: 08/11/20 12:49 PM   Specimen: BLOOD  Result Value Ref Range Status   Specimen Description   Final    BLOOD SITE NOT SPECIFIED Performed at [facility], [address], [place], [place]    Special Requests   Final    BOTTLES DRAWN AEROBIC AND ANAEROBIC Blood Culture adequate volume Performed at [facility], [address], [place], [place]    Culture   Final    NO GROWTH < 24 HOURS Performed at [facility], [address] [place], [place]    Report Status PENDING  Incomplete      Studies: No results found.  Scheduled Meds: . albuterol  2 puff Inhalation Q6H  . vitamin C  500 mg Oral Daily  . calcium-vitamin D  0.5 tablet  Oral BID  . dexamethasone (DECADRON) injection  6 mg Intravenous Q24H  . enoxaparin (LOVENOX) injection  60 mg Subcutaneous Q24H  . Ferrous Fumarate  1 tablet Oral QHS  . multivitamins with iron  1 tablet Oral Daily  . sodium chloride flush  3 mL Intravenous Q12H  . zinc sulfate  220 mg Oral Daily    Continuous Infusions: . sodium chloride Stopped (08/12/20 2205)  . remdesivir 100 mg in NS 100 mL 100 mg (08/13/20 1044)     LOS: 2 days     10/13/20, MD [facility]   08/13/2020, 2:50 PM

## 2020-08-14 ENCOUNTER — Inpatient Hospital Stay (HOSPITAL_COMMUNITY): Payer: BC Managed Care – PPO

## 2020-08-14 DIAGNOSIS — R001 Bradycardia, unspecified: Secondary | ICD-10-CM

## 2020-08-14 DIAGNOSIS — E669 Obesity, unspecified: Secondary | ICD-10-CM

## 2020-08-14 DIAGNOSIS — R0602 Shortness of breath: Secondary | ICD-10-CM

## 2020-08-14 DIAGNOSIS — J9600 Acute respiratory failure, unspecified whether with hypoxia or hypercapnia: Secondary | ICD-10-CM

## 2020-08-14 DIAGNOSIS — J1282 Pneumonia due to coronavirus disease 2019: Secondary | ICD-10-CM

## 2020-08-14 DIAGNOSIS — U071 COVID-19: Principal | ICD-10-CM

## 2020-08-14 LAB — COMPREHENSIVE METABOLIC PANEL
ALT: 56 U/L — ABNORMAL HIGH (ref 0–44)
AST: 57 U/L — ABNORMAL HIGH (ref 15–41)
Albumin: 3 g/dL — ABNORMAL LOW (ref 3.5–5.0)
Alkaline Phosphatase: 37 U/L — ABNORMAL LOW (ref 38–126)
Anion gap: 11 (ref 5–15)
BUN: 15 mg/dL (ref 6–20)
CO2: 27 mmol/L (ref 22–32)
Calcium: 9.2 mg/dL (ref 8.9–10.3)
Chloride: 100 mmol/L (ref 98–111)
Creatinine, Ser: 0.58 mg/dL (ref 0.44–1.00)
GFR calc Af Amer: >60 mL/min (ref >60)
GFR calc non Af Amer: >60 mL/min (ref >60)
Glucose, Bld: 120 mg/dL — ABNORMAL HIGH (ref 70–99)
Potassium: 3.9 mmol/L (ref 3.5–5.1)
Sodium: 138 mmol/L (ref 135–145)
Total Bilirubin: 0.6 mg/dL (ref 0.3–1.2)
Total Protein: 6.9 g/dL (ref 6.5–8.1)

## 2020-08-14 LAB — TSH: TSH: 0.264 u[IU]/mL — ABNORMAL LOW (ref 0.350–4.500)

## 2020-08-14 LAB — CBC WITH DIFFERENTIAL/PLATELET
Abs Immature Granulocytes: 0.12 10*3/uL — ABNORMAL HIGH (ref 0.00–0.07)
Basophils Absolute: 0 10*3/uL (ref 0.0–0.1)
Basophils Relative: 0 %
Eosinophils Absolute: 0 10*3/uL (ref 0.0–0.5)
Eosinophils Relative: 0 %
HCT: 36.1 % (ref 36.0–46.0)
Hemoglobin: 11.2 g/dL — ABNORMAL LOW (ref 12.0–15.0)
Immature Granulocytes: 1 %
Lymphocytes Relative: 15 %
Lymphs Abs: 1.6 10*3/uL (ref 0.7–4.0)
MCH: 22.5 pg — ABNORMAL LOW (ref 26.0–34.0)
MCHC: 31 g/dL (ref 30.0–36.0)
MCV: 72.6 fL — ABNORMAL LOW (ref 80.0–100.0)
Monocytes Absolute: 1 10*3/uL (ref 0.1–1.0)
Monocytes Relative: 10 %
Neutro Abs: 8 10*3/uL — ABNORMAL HIGH (ref 1.7–7.7)
Neutrophils Relative %: 74 %
Platelets: 467 10*3/uL — ABNORMAL HIGH (ref 150–400)
RBC: 4.97 MIL/uL (ref 3.87–5.11)
RDW: 15.6 % — ABNORMAL HIGH (ref 11.5–15.5)
WBC: 10.8 10*3/uL — ABNORMAL HIGH (ref 4.0–10.5)
nRBC: 0.2 % (ref 0.0–0.2)

## 2020-08-14 LAB — ECHOCARDIOGRAM COMPLETE
Area-P 1/2: 2.56 cm2
Height: 71 in
S' Lateral: 3.2 cm
Weight: 4112 oz

## 2020-08-14 LAB — FERRITIN: Ferritin: 74 ng/mL (ref 11–307)

## 2020-08-14 LAB — MRSA PCR SCREENING: MRSA by PCR: NEGATIVE

## 2020-08-14 LAB — T4, FREE: Free T4: 1.07 ng/dL (ref 0.61–1.12)

## 2020-08-14 LAB — D-DIMER, QUANTITATIVE: D-Dimer, Quant: 0.34 ug/mL-FEU (ref 0.00–0.50)

## 2020-08-14 LAB — GLUCOSE, CAPILLARY: Glucose-Capillary: 123 mg/dL — ABNORMAL HIGH (ref 70–99)

## 2020-08-14 LAB — PHOSPHORUS: Phosphorus: 5.4 mg/dL — ABNORMAL HIGH (ref 2.5–4.6)

## 2020-08-14 LAB — TROPONIN I (HIGH SENSITIVITY)
Troponin I (High Sensitivity): 4 ng/L (ref <18)
Troponin I (High Sensitivity): 4 ng/L (ref <18)
Troponin I (High Sensitivity): 4 ng/L (ref <18)

## 2020-08-14 LAB — MAGNESIUM: Magnesium: 1.9 mg/dL (ref 1.7–2.4)

## 2020-08-14 LAB — C-REACTIVE PROTEIN: CRP: 3.1 mg/dL — ABNORMAL HIGH (ref <1.0)

## 2020-08-14 MED ORDER — ATROPINE SULFATE 1 MG/10ML IJ SOSY
1.0000 mg | PREFILLED_SYRINGE | Freq: Once | INTRAMUSCULAR | Status: DC
  Filled 2020-08-14: qty 10

## 2020-08-14 MED ORDER — SODIUM CHLORIDE 0.9% FLUSH
10.0000 mL | Freq: Two times a day (BID) | INTRAVENOUS | Status: DC
  Administered 2020-08-14: 10 mL
  Administered 2020-08-15: 40 mL
  Administered 2020-08-15 – 2020-08-17 (×3): 10 mL

## 2020-08-14 MED ORDER — CHLORHEXIDINE GLUCONATE CLOTH 2 % EX PADS
6.0000 | MEDICATED_PAD | Freq: Every day | CUTANEOUS | Status: DC
  Administered 2020-08-14 – 2020-08-17 (×4): 6 via TOPICAL

## 2020-08-14 MED ORDER — SODIUM CHLORIDE 0.9% FLUSH: 10.0000 mL | INTRAVENOUS | Status: DC | PRN

## 2020-08-14 MED ORDER — ALBUTEROL SULFATE HFA 108 (90 BASE) MCG/ACT IN AERS
2.0000 | INHALATION_SPRAY | Freq: Three times a day (TID) | RESPIRATORY_TRACT | Status: DC
  Administered 2020-08-15 – 2020-08-17 (×7): 2 via RESPIRATORY_TRACT

## 2020-08-14 MED ORDER — ORAL CARE MOUTH RINSE
15.0000 mL | Freq: Two times a day (BID) | OROMUCOSAL | Status: DC
  Administered 2020-08-14 – 2020-08-18 (×8): 15 mL via OROMUCOSAL

## 2020-08-14 MED ORDER — DICYCLOMINE HCL 10 MG PO CAPS: 10.0000 mg | ORAL_CAPSULE | Freq: Once | ORAL | Status: DC

## 2020-08-14 MED ORDER — BARICITINIB 2 MG PO TABS
4.0000 mg | ORAL_TABLET | Freq: Every day | ORAL | Status: DC
  Administered 2020-08-14 – 2020-08-18 (×5): 4 mg via ORAL
  Filled 2020-08-14 (×5): qty 2

## 2020-08-14 NOTE — Progress Notes (Signed)
PROGRESS NOTE    [name] [name]  ZOX:[id number] DOB: Oct 11, 1986 DOA: 08/11/2020 PCP: Burnis Medin, PA-C    Brief Narrative:  [name] [name] is a 34 year old female with past medical history of morbid obesity who started having symptoms of cough and mild shortness of breath starting on 9/6, but then tested positive for Covid on 9/7 and then came into the emergency room on 9/10 for worsening shortness of breath.   In the ED, she was found to have an oxygen saturation of 84%.  She required 4 L nasal cannula.  Initial lab work noteworthy for CRP of 15, lactic acid level of only 1.2 and chest x-ray noting bilateral mid and lower lung opacities consistent with multifocal pneumonia.  Patient is unvaccinated. Admitted to the hospitalist service.  Started on IV Decadron and Remdisivir.   Assessment & Plan:   Principal Problem:   Pneumonia due to COVID-19 virus Active Problems:   Acute respiratory failure due to COVID-19 (HCC)   Obesity (BMI 30-39.9)   Acute hypoxic respiratory failure secondary to acute Covid-19 viral pneumonia during the ongoing 2020 Covid 19 Pandemic - POA Patient presenting to the ED with cough and progressive shortness of breath.  Found to be hypoxic with SPO2 84% on room air.  Elevated CRP 15, lactic acid 1.2 and chest x-ray notable for bilateral mid and lower lung opacities consistent with multifocal pneumonia.  Patient is unvaccinated. --COVID test: + on 08/08/2020 in Care everywhere from [facility]  --CRP 17.0>7.1>3.1 --ddimer 0.59>0.50>0.34 --Remdesivir, plan 5-day course (Day #4/5) --Continue dexamethasone 6 mg IV daily (Day #4); will hold Decadron tomorrow for concern of possible steroid-induced bradycardia --prone for 2-3hrs every 12hrs if able --Continue supplemental oxygen, titrate to maintain SPO2 greater than 92%, currently on 8L [place] with SpO2 92% --Continue supportive care with albuterol MDI prn, vitamin C, zinc, Tylenol, antitussives (benzonatate/  Mucinex/Tussionex) --Follow CBC, CMP, D-dimer, ferritin, and CRP daily --Continue airborne/contact isolation precautions for 3 weeks from the day of diagnosis  The treatment plan and use of medications and known side effects were discussed with patient/family. Some of the medications used are based on case reports/anecdotal data.  All other medications being used in the management of COVID-19 based on limited study data.  Complete risks and long-term side effects are unknown, however in the best clinical judgment they seem to be of some benefit.  Patient wanted to proceed with treatment options provided.  Symptomatic bradycardia During the night of 08/13/2020, patient complained of weakness, lethargy, dizziness with episode of nausea and vomiting.  Her heart rate has noted to decrease from the mid 70s on admission to mid 30s overnight.  EKG notable for sinus bradycardia, no heart block appreciated.  There is T wave inversions diffusely V1 through V6 which were similar in appearance on 08/11/2020 during her admission. --Unclear etiology, possibly related to high-dose steroids for Covid pneumonia as above; will hold scheduled Decadron planned for tomorrow --Check TSH, free T4 --Trend troponin --Echocardiogram ordered --Cardiology consulted for evaluation and further recommendations --Continue to monitor on telemetry  Obesity Body mass index is 35.84 kg/m. --Patient counseled on need for aggressive weight loss measures/lifestyle changes as this complicates all facets of care   DVT prophylaxis: Lovenox Code Status: Full code Family Communication: Updated patient's father via telephone this morning  Disposition Plan:  Status is: Inpatient Hello Kevin Fenton. Kevin Fenton Remains inpatient appropriate because:Ongoing diagnostic testing needed not appropriate for outpatient work up, Unsafe d/c plan, IV treatments appropriate due to intensity of illness or inability  to take PO and Inpatient level of care  appropriate due to severity of illness   Dispo: The patient is from: Home              Anticipated d/c is to: Home              Anticipated d/c date is: 3 days              Patient currently is not medically stable to d/c.   Consultants:   PCCM  Cardiology  Procedures:   Echocardiogram: Pending  Antimicrobials:   Remdesivir 9/10 - 9/14   Subjective: Patient seen and examined bedside, resting comfortably.  Earlier this morning, rapid response for symptomatic bradycardia in which patient was transferred to stepdown unit.  Patient had symptoms of weakness, lightheadedness and episode of nausea and vomiting.  EKG notable for sinus bradycardia.  No heart blocks noted on telemetry.  Patient states lightheadedness and nausea/vomiting currently improved/resolved.  No other complaints or concerns at this time.  Denies headache, no visual changes, no chest pain, no palpitations, no abdominal pain.  No other acute events overnight per nursing staff.  Objective: Vitals:   08/14/20 0719 08/14/20 0800 08/14/20 0900 08/14/20 1000  BP: 100/63 (!) 105/54 (!) 102/57 (!) 115/59  Pulse: (!) 43 (!) 36 (!) 37 (!) 47  Resp: (!) 21 (!) 21 (!) 23 14  Temp:  97.6 F (36.4 C)    TempSrc:  Oral    SpO2: 92% 95% 97% (!) 88%  Weight:      Height:        Intake/Output Summary (Last 24 hours) at 08/14/2020 1109 Last data filed at 08/13/2020 1300 Gross per 24 hour  Intake 255 ml  Output --  Net 255 ml   Filed Weights   08/11/20 2022  Weight: 116.6 kg    Examination:  General exam: Appears calm and comfortable  Respiratory system: Clear to auscultation. Respiratory effort normal.  On 8 L nasal cannula with SPO2 92% Cardiovascular system: S1 & S2 heard, RRR. No JVD, murmurs, rubs, gallops or clicks. No pedal edema. Gastrointestinal system: Abdomen is nondistended, soft and nontender. No organomegaly or masses felt. Normal bowel sounds heard. Central nervous system: Alert and oriented. No focal  neurological deficits. Extremities: Symmetric 5 x 5 power. Skin: No rashes, lesions or ulcers Psychiatry: Judgement and insight appear normal. Mood & affect appropriate.     Data Reviewed: I have personally reviewed following labs and imaging studies  CBC: Recent Labs  Lab 08/11/20 1249 08/12/20 0450 08/13/20 0502 08/14/20 0515  WBC 7.9 5.9 8.6 10.8*  NEUTROABS 6.6 4.7 6.5 8.0*  HGB 12.1 11.3* 11.2* 11.2*  HCT 39.0 37.0 36.9 36.1  MCV 72.8* 72.5* 72.9* 72.6*  PLT 265 295 362 467*   Basic Metabolic Panel: Recent Labs  Lab 08/11/20 1249 08/12/20 0450 08/13/20 1142 08/14/20 0515  NA 137 140 139 138  K 4.6 3.8 3.7 3.9  CL 100 101 100 100  CO2 24 25 26 27   GLUCOSE 108* 136* 124* 120*  BUN 7 9 13 15   CREATININE 0.67 0.55 0.65 0.58  CALCIUM 8.7* 9.1 9.4 9.2  MG  --  2.2 2.2 1.9  PHOS  --  3.1 2.9 5.4*   GFR: Estimated Creatinine Clearance: 139.4 mL/min (by C-G formula based on SCr of 0.58 mg/dL). Liver Function Tests: Recent Labs  Lab 08/11/20 1249 08/12/20 0450 08/13/20 1142 08/14/20 0515  AST 52* 46* 48* 57*  ALT 23 28 37  56*  ALKPHOS 39 43 42 37*  BILITOT 0.6 0.7 0.4 0.6  PROT 7.8 7.6 7.8 6.9  ALBUMIN 3.4* 3.2* 3.2* 3.0*   No results for input(s): LIPASE, AMYLASE in the last 168 hours. No results for input(s): AMMONIA in the last 168 hours. Coagulation Profile: No results for input(s): INR, PROTIME in the last 168 hours. Cardiac Enzymes: No results for input(s): CKTOTAL, CKMB, CKMBINDEX, TROPONINI in the last 168 hours. BNP (last 3 results) No results for input(s): PROBNP in the last 8760 hours. HbA1C: No results for input(s): HGBA1C in the last 72 hours. CBG: Recent Labs  Lab 08/14/20 0659  GLUCAP 123*   Lipid Profile: Recent Labs    08/11/20 1249  TRIG 143   Thyroid Function Tests: Recent Labs    08/14/20 0840  TSH 0.264*   Anemia Panel: Recent Labs    08/13/20 1142 08/14/20 0515  FERRITIN 111 74   Sepsis Labs: Recent Labs    Lab 08/11/20 1249 08/11/20 2136  PROCALCITON <0.10  --   LATICACIDVEN 1.2 0.9    Recent Results (from the past 240 hour(s))  Blood Culture (routine x 2)     Status: None (Preliminary result)   Collection Time: 08/11/20 12:49 PM   Specimen: BLOOD  Result Value Ref Range Status   Specimen Description   Final    BLOOD SITE NOT SPECIFIED Performed at [facility], [address] [place], [place] [address]    Special Requests   Final    BOTTLES DRAWN AEROBIC AND ANAEROBIC Blood Culture adequate volume Performed at [facility], [address] [place], [place] [address]    Culture   Final    NO GROWTH 2 DAYS Performed at [facility], [address] [place], [place] [address]    Report Status PENDING  Incomplete  Blood Culture (routine x 2)     Status: None (Preliminary result)   Collection Time: 08/11/20  9:36 PM   Specimen: BLOOD LEFT HAND  Result Value Ref Range Status   Specimen Description   Final    BLOOD LEFT HAND Performed at [facility], [address] [place], [place] [address]    Special Requests   Final    BOTTLES DRAWN AEROBIC ONLY Blood Culture adequate volume Performed at [facility], [address] [place], [place] [address]    Culture   Final    NO GROWTH 1 DAY Performed at [facility], [address] [place], [place] [address]    Report Status PENDING  Incomplete         Radiology Studies: DG CHEST PORT 1 VIEW  Result Date: 08/14/2020 CLINICAL DATA:  Cough, shortness of breath EXAM: PORTABLE CHEST 1 VIEW COMPARISON:  08/11/2020 FINDINGS: No significant interval change in low volume AP portable chest radiograph with bilateral heterogeneous and interstitial airspace opacity and possible small layering pleural effusions. No new or focal airspace opacity. Mild cardiomegaly. IMPRESSION: No significant interval change in low volume AP portable chest radiograph  with bilateral heterogeneous and interstitial airspace opacity and possible small layering pleural effusions. No new or focal airspace opacity. Electronically Signed   By: Lauralyn PrimesAlex  Bibbey M.D.   On: 08/14/2020 08:14        Scheduled Meds: . albuterol  2 puff Inhalation Q6H  . atropine  1 mg Intravenous Once  . calcium-vitamin D  0.5 tablet Oral BID  . Chlorhexidine Gluconate Cloth  6 each Topical Daily  .  dexamethasone (DECADRON) injection  6 mg Intravenous Q24H  . dicyclomine  10 mg Oral Once  . enoxaparin (LOVENOX) injection  60 mg Subcutaneous Q24H  . Ferrous Fumarate  1 tablet Oral QHS  . mouth rinse  15 mL Mouth Rinse BID  . multivitamins with iron  1 tablet Oral Daily  . sodium chloride flush  3 mL Intravenous Q12H   Continuous Infusions: . sodium chloride Stopped (08/12/20 2205)  . remdesivir 100 mg in NS 100 mL 100 mg (08/13/20 1044)     LOS: 3 days    Time spent: 38 minutes spent on chart review, discussion with nursing staff, consultants, updating family and interview/physical exam; more than 50% of that time was spent in counseling and/or coordination of care.    Alvira Philips [place], DO [facility] Available via Epic secure chat 7am-7pm After these hours, please refer to coverage provider listed on [email or web address] 08/14/2020, 11:09 AM

## 2020-08-14 NOTE — Progress Notes (Signed)
The writer was made aware by the primary nurse [facility] LPN that the pt's HR was noted to be in the 40's, she is currently stating that she feel lightheaded, dizzy, and  " feeling weak this morning". The pt  appears to have decreased energy, her bil radial pulse is weak and trendy, skin feel cool  to touch v/s completed ekg shows SB. Made NP Blount aware. No orders given at this time.

## 2020-08-14 NOTE — Consult Note (Signed)
NAME:  [name] [name], MRN:  [id number], DOB:  Jun 29, 1986, LOS: 3 ADMISSION DATE:  08/11/2020, CONSULTATION DATE:  07/14/20 REFERRING MD: Eric [place] DO , CHIEF COMPLAINT:  Shortness of breath  Brief History   [name] [name] is a 34 year old woman with history of obesity who presented to the ER on 08/11/20 with shortness of breath. She tested positive for covid 19 on 08/08/20 and was symptomatic 4 days prior to testing.   History of present illness   [name] [name] is a 34 year old woman with history of obesity who presented to the ER on 08/11/20 with shortness of breath. She tested positive for covid 19 on 08/08/20 and was symptomatic 4 days prior to testing. She was found to have an initial O2 saturation of 84% on room air. She was placed on 4L supplemental oxygen with improvement of her saturations. She was admitted to the general medical floor, started on remdesevir and decadron 6mg  daily. She has remained on 4L via [place] but developed bradycardia with a HR in the 30-40s with associated dizziness and feeling light headed.   The patient has been transferred to the ICU for the bradycardia and PCCM has been consulted.     Past Medical History  Obesity  Significant Hospital Events     Consults:  PCCM Cardiology  Procedures:    Significant Diagnostic Tests:  9/13 EKG with inverted T waves in leads V1-V6   9/13 ECHO: Normal EF. Trace pericardial effusion.   Micro Data:  Bld Cx 9/10> Negative MRSA Screen 9/13> Negative  Antimicrobials:    Interim history/subjective:  Patient is resting comfortably in bed. She is short of breath and complains of lower abdominal cramping discomfort. Otherwise without complaints at this time.  Objective   Blood pressure (!) 112/59, pulse (!) 43, temperature 97.6 F (36.4 C), temperature source Oral, resp. rate (!) 27, height 5\' 11"  (1.803 m), weight 116.6 kg, SpO2 (!) 89 %.       No intake or output data in the 24 hours ending 08/14/20 1325 Filed  Weights   08/11/20 2022  Weight: 116.6 kg    Examination: General: no acute distress, resting in bed HENT: EOMI, PERRL, sclera anicteric Lungs: diminished breath sounds bilaterally. No wheezing or rhonchi Cardiovascular: bradycardic, s1s2, no murmurs Abdomen: soft, non-tender, non-distended, bowel sounds present Extremities: no edema, warm dry Neuro: alert and oriented, moves all extremities GU: No foley in place  Resolved Hospital Problem list     Assessment & Plan:  [name] [name] is a 34 year old woman with history of obesity who was admitted on 9/10 with acute hypoxemic respiratory failure from covid 19 pneumonia.  Acute Hypoxemic Respiratory Failure secondary to covid 19 - Continue supplemental oxygen via nasal canula - Continue decadron 6mg  daily. Would recommend continuing steroid therapy in setting of covid 19 pneumonia despite the concerns it could be leading to bradycardia. - Continue remdesevir - She has had increasing O2 requirements between 4-8L, so we will start baricitinib today for a 14 day course.   Bradycardia Appeared to be symptomatic at one point, cardiology has evaluated patient - ECHO is normal - Troponins are flat - Continue to monitor via telemtry  Hyperglycemia: - Continue to monitor, add SSI if needed  Best practice:  Diet: regluar Pain/Anxiety/Delirium protocol (if indicated): n/a VAP protocol (if indicated): n/a DVT prophylaxis: lovenox GI prophylaxis: n/a Glucose control: n/a Mobility: bed rest Code Status: full Family Communication: discussed recommendations with patient Disposition: ICU  Labs  CBC: Recent Labs  Lab 08/11/20 1249 08/12/20 0450 08/13/20 0502 08/14/20 0515  WBC 7.9 5.9 8.6 10.8*  NEUTROABS 6.6 4.7 6.5 8.0*  HGB 12.1 11.3* 11.2* 11.2*  HCT 39.0 37.0 36.9 36.1  MCV 72.8* 72.5* 72.9* 72.6*  PLT 265 295 362 467*    Basic Metabolic Panel: Recent Labs  Lab 08/11/20 1249 08/12/20 0450 08/13/20 1142  08/14/20 0515  NA 137 140 139 138  K 4.6 3.8 3.7 3.9  CL 100 101 100 100  CO2 24 25 26 27   GLUCOSE 108* 136* 124* 120*  BUN 7 9 13 15   CREATININE 0.67 0.55 0.65 0.58  CALCIUM 8.7* 9.1 9.4 9.2  MG  --  2.2 2.2 1.9  PHOS  --  3.1 2.9 5.4*   GFR: Estimated Creatinine Clearance: 139.4 mL/min (by C-G formula based on SCr of 0.58 mg/dL). Recent Labs  Lab 08/11/20 1249 08/11/20 2136 08/12/20 0450 08/13/20 0502 08/14/20 0515  PROCALCITON <0.10  --   --   --   --   WBC 7.9  --  5.9 8.6 10.8*  LATICACIDVEN 1.2 0.9  --   --   --     Liver Function Tests: Recent Labs  Lab 08/11/20 1249 08/12/20 0450 08/13/20 1142 08/14/20 0515  AST 52* 46* 48* 57*  ALT 23 28 37 56*  ALKPHOS 39 43 42 37*  BILITOT 0.6 0.7 0.4 0.6  PROT 7.8 7.6 7.8 6.9  ALBUMIN 3.4* 3.2* 3.2* 3.0*   No results for input(s): LIPASE, AMYLASE in the last 168 hours. No results for input(s): AMMONIA in the last 168 hours.  ABG No results found for: PHART, PCO2ART, PO2ART, HCO3, TCO2, ACIDBASEDEF, O2SAT   Coagulation Profile: No results for input(s): INR, PROTIME in the last 168 hours.  Cardiac Enzymes: No results for input(s): CKTOTAL, CKMB, CKMBINDEX, TROPONINI in the last 168 hours.  HbA1C: No results found for: HGBA1C  CBG: Recent Labs  Lab 08/14/20 0659  GLUCAP 123*    Review of Systems:   Review of Systems  Constitutional: Positive for malaise/fatigue. Negative for chills, diaphoresis, fever and weight loss.  HENT: Positive for congestion. Negative for nosebleeds, sinus pain and sore throat.   Eyes: Negative for double vision.  Respiratory: Positive for cough and shortness of breath. Negative for hemoptysis, sputum production and wheezing.   Cardiovascular: Negative for chest pain, palpitations, orthopnea, claudication, leg swelling and PND.  Gastrointestinal: Positive for abdominal pain. Negative for blood in stool, constipation, diarrhea, heartburn, nausea and vomiting.  Genitourinary:  Negative for dysuria, frequency and hematuria.  Musculoskeletal: Negative for back pain and myalgias.  Skin: Negative for itching and rash.  Neurological: Negative for dizziness, seizures, weakness and headaches.  Endo/Heme/Allergies: Does not bruise/bleed easily.    Past Medical History  She,  has a past medical history of Anemia.   Surgical History    Past Surgical History:  Procedure Laterality Date  . APPENDECTOMY    . APPLICATION OF WOUND VAC  01/29/2012   Procedure: APPLICATION OF WOUND VAC;  Surgeon: 08/16/20, MD;  Location: [facility];  Service: General;;  . LAPAROSCOPY  01/29/2012   Procedure: LAPAROSCOPY DIAGNOSTIC;  Surgeon: Robyne Askew, MD;  Location: [facility];  Service: General;  Laterality: N/A;  . LAPAROTOMY  01/29/2012   Procedure: EXPLORATORY LAPAROTOMY;  Surgeon: Robyne Askew, MD;  Location: [facility];  Service: General;  Laterality: N/A;  OPEN RIGHT COLECTOMY ,TERMINAL ILEUM AND APPENDECTOMY     Social  History   reports that she has never smoked. She has never used smokeless tobacco. She reports that she does not drink alcohol and does not use drugs.   Family History   Her family history is not on file.   Allergies Allergies  Allergen Reactions  . Sulfa Antibiotics Hives     Home Medications  Prior to Admission medications   Medication Sig Start Date End Date Taking? Authorizing Provider  albuterol (VENTOLIN HFA) 108 (90 Base) MCG/ACT inhaler Inhale 2 puffs into the lungs every 4 (four) hours as needed for wheezing or shortness of breath.  08/09/20  Yes [provider]  calcium-vitamin D 250-100 MG-UNIT per tablet Take 1 tablet by mouth 2 (two) times daily.   Yes [provider]  ferrous fumarate (HEMOCYTE - 106 MG FE) 325 (106 FE) MG TABS Take 1 tablet (106 mg of iron total) by mouth at bedtime. 02/19/12  Yes Sherrie George, PA-C  Multiple Vitamins-Iron (MULTIVITAMINS WITH IRON) TABS Take 1 tablet by mouth daily. 02/19/12  Yes Sherrie George, PA-C     Melody Comas, MD [facility] Office: [phone]   See Amion for Pager Details

## 2020-08-14 NOTE — Consult Note (Signed)
Cardiology Consultation:   Patient ID: [name] [name] MRN: [id number]; DOB: 1986-05-14  Admit date: 08/11/2020 Date of Consult: 08/14/2020  Primary Care Provider: Burnis MedinFulbright, Virginia E, PA-C [facility] Cardiologist: No primary care provider on file. New [facility] Electrophysiologist:  None    Patient Profile:   Thy [name] [name] is a 34 y.o. female with a hx of multinodular goiter, moderate obesity, iron deficiency anemia admitted for COVID-19 pneumonia who is being seen today for the evaluation of bradycardia at the request of Dr. [place].  History of Present Illness:   Ms. [name] [name] was admitted on 9/10 for worsening dyspnea related to COVID-19 pneumonia with hypoxemia (oxygen saturation minimum 84%, on 4 L nasal cannula).  During her hospitalization she has been persistently bradycardic, typically with heart rates in the 40s, as low as 38, increased to 50s when she is alert and stimulated, occasionally in the 70s when she is uncomfortable.  The rhythm is always sinus bradycardia/sinus rhythm.  There is no evidence of junctional escape rhythm.  The QRS complex is consistently narrow.  Bradycardia has been present when oxygen saturations were normal as well as when they were low.  She had not received the COVID-19 vaccine.  She has been receiving treatment with remdesivir and dexamethasone.  Her CRP has been steadily decreasing from a maximum of 17 down to 3.1.  The D-dimer is also showing a downward trend.  Her ECG shows sinus bradycardia and there has not been any evidence of ST segment deviation.  She has no history of cardiac illness or hypertension or diabetes mellitus.  There is no family history of early pacemaker implantation or arrhythmia.  Her TSH last winter was normal and is mildly suppressed at 0.203 on this admission, with normal free T4.  Prior to her acute illness she considered herself as being very fit, although she was obese.  She is a previous basketball and volleyball  Education officer, communitycompetitive player.  She walks an hour a day 7 days a week.   Past Medical History:  Diagnosis Date  . Anemia     Past Surgical History:  Procedure Laterality Date  . APPENDECTOMY    . APPLICATION OF WOUND VAC  01/29/2012   Procedure: APPLICATION OF WOUND VAC;  Surgeon: Robyne AskewPaul S Toth III, MD;  Location: [facility];  Service: General;;  . LAPAROSCOPY  01/29/2012   Procedure: LAPAROSCOPY DIAGNOSTIC;  Surgeon: Robyne AskewPaul S Toth III, MD;  Location: [facility];  Service: General;  Laterality: N/A;  . LAPAROTOMY  01/29/2012   Procedure: EXPLORATORY LAPAROTOMY;  Surgeon: Robyne AskewPaul S Toth III, MD;  Location: [facility];  Service: General;  Laterality: N/A;  OPEN RIGHT COLECTOMY ,TERMINAL ILEUM AND APPENDECTOMY     Home Medications:  Prior to Admission medications   Medication Sig Start Date End Date Taking? Authorizing Provider  albuterol (VENTOLIN HFA) 108 (90 Base) MCG/ACT inhaler Inhale 2 puffs into the lungs every 4 (four) hours as needed for wheezing or shortness of breath.  08/09/20  Yes [provider]  calcium-vitamin D 250-100 MG-UNIT per tablet Take 1 tablet by mouth 2 (two) times daily.   Yes [provider]  ferrous fumarate (HEMOCYTE - 106 MG FE) 325 (106 FE) MG TABS Take 1 tablet (106 mg of iron total) by mouth at bedtime. 02/19/12  Yes Sherrie GeorgeJennings, Willard, PA-C  Multiple Vitamins-Iron (MULTIVITAMINS WITH IRON) TABS Take 1 tablet by mouth daily. 02/19/12  Yes Sherrie GeorgeJennings, Willard, PA-C    Inpatient Medications: Scheduled Meds: . albuterol  2 puff Inhalation Q6H  .  atropine  1 mg Intravenous Once  . calcium-vitamin D  0.5 tablet Oral BID  . Chlorhexidine Gluconate Cloth  6 each Topical Daily  . dicyclomine  10 mg Oral Once  . enoxaparin (LOVENOX) injection  60 mg Subcutaneous Q24H  . Ferrous Fumarate  1 tablet Oral QHS  . mouth rinse  15 mL Mouth Rinse BID  . multivitamins with iron  1 tablet Oral Daily  . sodium chloride flush  3 mL Intravenous Q12H   Continuous Infusions: . sodium  chloride Stopped (08/12/20 2205)  . remdesivir 100 mg in NS 100 mL 100 mg (08/14/20 1123)   PRN Meds: sodium chloride, acetaminophen, guaiFENesin-dextromethorphan, ondansetron **OR** ondansetron (ZOFRAN) IV, sodium chloride flush  Allergies:    Allergies  Allergen Reactions  . Sulfa Antibiotics Hives    Social History:   Social History   Socioeconomic History  . Marital status: Single    Spouse name: Not on file  . Number of children: Not on file  . Years of education: Not on file  . Highest education level: Not on file  Occupational History  . Not on file  Tobacco Use  . Smoking status: Never Smoker  . Smokeless tobacco: Never Used  Substance and Sexual Activity  . Alcohol use: No  . Drug use: No  . Sexual activity: Not on file  Other Topics Concern  . Not on file  Social History Narrative  . Not on file   Social Determinants of Health   Financial Resource Strain:   . Difficulty of Paying Living Expenses: Not on file  Food Insecurity:   . Worried About Programme researcher, broadcasting/film/video in the Last Year: Not on file  . Ran Out of Food in the Last Year: Not on file  Transportation Needs:   . Lack of Transportation (Medical): Not on file  . Lack of Transportation (Non-Medical): Not on file  Physical Activity:   . Days of Exercise per Week: Not on file  . Minutes of Exercise per Session: Not on file  Stress:   . Feeling of Stress : Not on file  Social Connections:   . Frequency of Communication with Friends and Family: Not on file  . Frequency of Social Gatherings with Friends and Family: Not on file  . Attends Religious Services: Not on file  . Active Member of Clubs or Organizations: Not on file  . Attends Banker Meetings: Not on file  . Marital Status: Not on file  Intimate Partner Violence:   . Fear of Current or Ex-Partner: Not on file  . Emotionally Abused: Not on file  . Physically Abused: Not on file  . Sexually Abused: Not on file    Family  History:   History reviewed. No pertinent family history.  Specifically no history of bradycardia/pacemakers or premature onset coronary or peripheral vascular disease.  ROS:  Please see the history of present illness.   All other ROS reviewed and negative.     Physical Exam/Data:   Vitals:   08/14/20 0800 08/14/20 0900 08/14/20 1000 08/14/20 1100  BP: (!) 105/54 (!) 102/57 (!) 115/59 (!) 113/53  Pulse: (!) 36 (!) 37 (!) 47 (!) 41  Resp: (!) 21 (!) 23 14 (!) 26  Temp: 97.6 F (36.4 C)     TempSrc: Oral     SpO2: 95% 97% (!) 88% 95%  Weight:      Height:        Intake/Output Summary (Last 24 hours) at 08/14/2020  1159 Last data filed at 08/13/2020 1300 Gross per 24 hour  Intake 255 ml  Output --  Net 255 ml   Last 3 Weights 08/11/2020 09/23/2014 03/26/2012  Weight (lbs) 257 lb 245 lb 203 lb 12.8 oz  Weight (kg) 116.574 kg 111.131 kg 92.443 kg     Body mass index is 35.84 kg/m.  General:  Well nourished, well developed, in no acute distress, moderately obese HEENT: She has a significant degree of exophthalmos (appears to be more than constitutional). Lymph: no adenopathy Neck: no JVD, moderate size goiter Endocrine:  No thryomegaly Vascular: No carotid bruits; FA pulses 2+ bilaterally without bruits  Cardiac:  normal S1, S2; RRR; no murmur  Lungs:  clear to auscultation bilaterally, no wheezing, rhonchi or rales  Abd: soft, nontender, no hepatomegaly  Ext: no edema Musculoskeletal:  No deformities, BUE and BLE strength normal and equal Skin: warm and dry  Neuro:  CNs 2-12 intact, no focal abnormalities noted Psych:  Normal affect   EKG:  The EKG was personally reviewed and demonstrates: Sinus bradycardia, T wave inversion in leads V1-V5.  There is no change between the tracings on 08/11/2020 and 08/14/2020 Telemetry:  Telemetry was personally reviewed and demonstrates: Sinus bradycardia 37-59 bpm  Relevant CV Studies: Echocardiogram pending  Laboratory Data:  High  Sensitivity Troponin:   Recent Labs  Lab 08/14/20 0655 08/14/20 0937  TROPONINIHS 4 4     Chemistry Recent Labs  Lab 08/12/20 0450 08/13/20 1142 08/14/20 0515  NA 140 139 138  K 3.8 3.7 3.9  CL 101 100 100  CO2 25 26 27   GLUCOSE 136* 124* 120*  BUN 9 13 15   CREATININE 0.55 0.65 0.58  CALCIUM 9.1 9.4 9.2  GFRNONAA >60 >60 >60  GFRAA >60 >60 >60  ANIONGAP 14 13 11     Recent Labs  Lab 08/12/20 0450 08/13/20 1142 08/14/20 0515  PROT 7.6 7.8 6.9  ALBUMIN 3.2* 3.2* 3.0*  AST 46* 48* 57*  ALT 28 37 56*  ALKPHOS 43 42 37*  BILITOT 0.7 0.4 0.6   Hematology Recent Labs  Lab 08/12/20 0450 08/13/20 0502 08/14/20 0515  WBC 5.9 8.6 10.8*  RBC 5.10 5.06 4.97  HGB 11.3* 11.2* 11.2*  HCT 37.0 36.9 36.1  MCV 72.5* 72.9* 72.6*  MCH 22.2* 22.1* 22.5*  MCHC 30.5 30.4 31.0  RDW 15.6* 16.0* 15.6*  PLT 295 362 467*   BNPNo results for input(s): BNP, PROBNP in the last 168 hours.  DDimer  Recent Labs  Lab 08/12/20 0450 08/13/20 0502 08/14/20 0515  DDIMER 0.59* 0.50 0.34     Radiology/Studies:  DG CHEST PORT 1 VIEW  Result Date: 08/14/2020 CLINICAL DATA:  Cough, shortness of breath EXAM: PORTABLE CHEST 1 VIEW COMPARISON:  08/11/2020 FINDINGS: No significant interval change in low volume AP portable chest radiograph with bilateral heterogeneous and interstitial airspace opacity and possible small layering pleural effusions. No new or focal airspace opacity. Mild cardiomegaly. IMPRESSION: No significant interval change in low volume AP portable chest radiograph with bilateral heterogeneous and interstitial airspace opacity and possible small layering pleural effusions. No new or focal airspace opacity. Electronically Signed   By: 08/16/20 M.D.   On: 08/14/2020 08:14   DG Chest Port 1 View  Result Date: 08/11/2020 CLINICAL DATA:  Shortness of breath.  COVID-19 positive EXAM: PORTABLE CHEST 1 VIEW COMPARISON:  January 24, 2012 FINDINGS: There is airspace opacity in both  mid and lower lung regions. Heart is upper normal in size  with pulmonary vascularity normal. No adenopathy. No bone lesions. IMPRESSION: Airspace opacity in both mid and lower lung regions consistent with multifocal pneumonia. Atypical organism pneumonia most likely given this appearance and clinical history. Heart upper normal in size.  No adenopathy demonstrable. Electronically Signed   By: Bretta Bang III M.D.   On: 08/11/2020 13:06   {   Assessment and Plan:   1. Sinus bradycardia: Some degree of bradycardia appears to be typical for her.  Multiple office visits in CareEverywhere show a typical heart rate in the 50-60 bpm range. Although obese, she does exercise frequently. On the other hand, this may be a sign of myocarditis.  She has prominent T wave inversion across the anterior precordial leads (we do not have any old ECGs for comparison).  She does not have any pleuritic chest pain to suggest concomitant pericarditis.  Troponin is not abnormal: 2 normal values from 7 AM and 9 AM today. Will wait for the echo results.  The bradycardia is completely asymptomatic and no specific treatment is necessary as long as the patient is alert and does not have signs of reduced cardiac output.      For questions or updates, please contact [facility] Please consult [email or web address] for contact info under    Signed, Thurmon Fair, MD  08/14/2020 11:59 AM

## 2020-08-14 NOTE — Progress Notes (Signed)
°  Echocardiogram 2D Echocardiogram has been performed.  Mayes Sangiovanni G Ruben Pyka 08/14/2020, 1:56 PM

## 2020-08-15 DIAGNOSIS — R001 Bradycardia, unspecified: Secondary | ICD-10-CM

## 2020-08-15 DIAGNOSIS — R9431 Abnormal electrocardiogram [ECG] [EKG]: Secondary | ICD-10-CM

## 2020-08-15 LAB — CBC WITH DIFFERENTIAL/PLATELET
Abs Immature Granulocytes: 0.16 10*3/uL — ABNORMAL HIGH (ref 0.00–0.07)
Basophils Absolute: 0 10*3/uL (ref 0.0–0.1)
Basophils Relative: 0 %
Eosinophils Absolute: 0 10*3/uL (ref 0.0–0.5)
Eosinophils Relative: 0 %
HCT: 32.7 % — ABNORMAL LOW (ref 36.0–46.0)
Hemoglobin: 10.3 g/dL — ABNORMAL LOW (ref 12.0–15.0)
Immature Granulocytes: 1 %
Lymphocytes Relative: 19 %
Lymphs Abs: 2.2 10*3/uL (ref 0.7–4.0)
MCH: 23.2 pg — ABNORMAL LOW (ref 26.0–34.0)
MCHC: 31.5 g/dL (ref 30.0–36.0)
MCV: 73.6 fL — ABNORMAL LOW (ref 80.0–100.0)
Monocytes Absolute: 1.1 10*3/uL — ABNORMAL HIGH (ref 0.1–1.0)
Monocytes Relative: 9 %
Neutro Abs: 8.4 10*3/uL — ABNORMAL HIGH (ref 1.7–7.7)
Neutrophils Relative %: 71 %
Platelets: 483 10*3/uL — ABNORMAL HIGH (ref 150–400)
RBC: 4.44 MIL/uL (ref 3.87–5.11)
RDW: 15.8 % — ABNORMAL HIGH (ref 11.5–15.5)
WBC: 11.9 10*3/uL — ABNORMAL HIGH (ref 4.0–10.5)
nRBC: 0.2 % (ref 0.0–0.2)

## 2020-08-15 LAB — COMPREHENSIVE METABOLIC PANEL
ALT: 61 U/L — ABNORMAL HIGH (ref 0–44)
AST: 41 U/L (ref 15–41)
Albumin: 2.7 g/dL — ABNORMAL LOW (ref 3.5–5.0)
Alkaline Phosphatase: 38 U/L (ref 38–126)
Anion gap: 14 (ref 5–15)
BUN: 14 mg/dL (ref 6–20)
CO2: 27 mmol/L (ref 22–32)
Calcium: 9.1 mg/dL (ref 8.9–10.3)
Chloride: 99 mmol/L (ref 98–111)
Creatinine, Ser: 0.64 mg/dL (ref 0.44–1.00)
GFR calc Af Amer: >60 mL/min (ref >60)
GFR calc non Af Amer: >60 mL/min (ref >60)
Glucose, Bld: 103 mg/dL — ABNORMAL HIGH (ref 70–99)
Potassium: 3.6 mmol/L (ref 3.5–5.1)
Sodium: 140 mmol/L (ref 135–145)
Total Bilirubin: 0.5 mg/dL (ref 0.3–1.2)
Total Protein: 6.5 g/dL (ref 6.5–8.1)

## 2020-08-15 LAB — FERRITIN: Ferritin: 74 ng/mL (ref 11–307)

## 2020-08-15 LAB — MAGNESIUM: Magnesium: 1.9 mg/dL (ref 1.7–2.4)

## 2020-08-15 LAB — C-REACTIVE PROTEIN: CRP: 1.9 mg/dL — ABNORMAL HIGH (ref <1.0)

## 2020-08-15 LAB — PHOSPHORUS: Phosphorus: 3.4 mg/dL (ref 2.5–4.6)

## 2020-08-15 MED ORDER — DEXAMETHASONE SODIUM PHOSPHATE 10 MG/ML IJ SOLN
6.0000 mg | INTRAMUSCULAR | Status: DC
  Administered 2020-08-15 – 2020-08-17 (×3): 6 mg via INTRAVENOUS
  Filled 2020-08-15 (×3): qty 1

## 2020-08-15 NOTE — Progress Notes (Signed)
PROGRESS NOTE    [name] [name]  GEX:[id number] DOB: 10-13-1986 DOA: 08/11/2020 PCP: Burnis Medin, PA-C    Brief Narrative:  [name] [name] is a 34 year old female with past medical history of morbid obesity who started having symptoms of cough and mild shortness of breath starting on 9/6, but then tested positive for Covid on 9/7 and then came into the emergency room on 9/10 for worsening shortness of breath.   In the ED, she was found to have an oxygen saturation of 84%.  She required 4 L nasal cannula.  Initial lab work noteworthy for CRP of 15, lactic acid level of only 1.2 and chest x-ray noting bilateral mid and lower lung opacities consistent with multifocal pneumonia.  Patient is unvaccinated. Admitted to the hospitalist service.  Started on IV Decadron and Remdisivir.   Assessment & Plan:   Principal Problem:   Pneumonia due to COVID-19 virus Active Problems:   Acute respiratory failure due to COVID-19 (HCC)   Obesity (BMI 30-39.9)   Acute hypoxic respiratory failure secondary to acute Covid-19 viral pneumonia during the ongoing 2020 Covid 19 Pandemic - POA Patient presenting to the ED with cough and progressive shortness of breath.  Found to be hypoxic with SPO2 84% on room air.  Elevated CRP 15, lactic acid 1.2 and chest x-ray notable for bilateral mid and lower lung opacities consistent with multifocal pneumonia.  Patient is unvaccinated. --COVID test: + on 08/08/2020 in Care everywhere from [facility] --CRP 17.0>7.1>3.1, labs pending for today --ddimer 0.59>0.50>0.34, labs pending for today --Baricitinib 4 mg p.o. daily (Day #2/4) --Remdesivir, plan 5-day course (Day #4/5) --Continue dexamethasone 6 mg IV daily (Day #5); will continue Decadron per PCCM recommendations --prone for 2-3hrs every 12hrs if able --Continue supplemental oxygen, titrate to maintain SPO2 greater than 92%, currently on 5L [place] (down from 8L yesterday) with SpO2 90% --Continue supportive  care with albuterol MDI prn, vitamin C, zinc, Tylenol, antitussives (benzonatate/ Mucinex/Tussionex) --Follow CBC, CMP, D-dimer, ferritin, and CRP daily --Continue airborne/contact isolation precautions for 3 weeks from the day of diagnosis  The treatment plan and use of medications and known side effects were discussed with patient/family. Some of the medications used are based on case reports/anecdotal data.  All other medications being used in the management of COVID-19 based on limited study data.  Complete risks and long-term side effects are unknown, however in the best clinical judgment they seem to be of some benefit.  Patient wanted to proceed with treatment options provided.  Symptomatic bradycardia During the night of 08/13/2020, patient complained of weakness, lethargy, dizziness with episode of nausea and vomiting.  Her heart rate has noted to decrease from the mid 70s on admission to mid 30s overnight.  EKG notable for sinus bradycardia, no heart block appreciated.  There is T wave inversions diffusely V1 through V6 which were similar in appearance on 08/11/2020 during her admission. --Unclear etiology --troponin 4>4>4, normal --TTE: LVEF 55-60%, normal LV function, no regional wall motion normalities, trivial pericardial effusion, no valvular abnormalities --TSH 0.264 (slightly low) with free T4 1.07 (normal) --Trend troponin --Echocardiogram ordered --Cardiology consulted for evaluation and further recommendations --Continue to monitor on telemetry  Obesity Body mass index is 35.84 kg/m. Patient counseled on need for aggressive weight loss measures/lifestyle changes as this complicates all facets of care  Weakness, debility, deconditioning: --PT evaluation   DVT prophylaxis: Lovenox Code Status: Full code Family Communication: Updated patient's father Kevin Fenton via telephone this morning  Disposition Plan:  Status is:  Inpatient Remains inpatient appropriate because:Ongoing  diagnostic testing needed not appropriate for outpatient work up, Unsafe d/c plan, IV treatments appropriate due to intensity of illness or inability to take PO and Inpatient level of care appropriate due to severity of illness   Dispo: The patient is from: Home              Anticipated d/c is to: Home              Anticipated d/c date is: 2 days              Patient currently is not medically stable to d/c.   Consultants:   PCCM  Cardiology  Procedures:  Echocardiogram: IMPRESSIONS  1. Marked sinus bradycardia. Left ventricular ejection fraction, by  estimation, is 55 to 60%. The left ventricle has normal function. The left  ventricle has no regional wall motion abnormalities. Left ventricular  diastolic parameters were normal.  2. Right ventricular systolic function is normal. The right ventricular  size is normal.  3. The pericardial effusion is posterior to the left ventricle.  4. The mitral valve is grossly normal. Trivial mitral valve  regurgitation.  5. The aortic valve is tricuspid. Aortic valve regurgitation is not  visualized.   Antimicrobials:   Remdesivir 9/10 - 9/14   Subjective: Patient seen and examined bedside, resting comfortably.  States shortness of breath improved since yesterday but continues with significant dyspnea with any type of minimal exertion.  Continues to be bradycardic on telemetry, pulmonology recommends continue steroids.  Cardiology following, TTE yesterday within normal limits.  Oxygen requirements slowly trending down from 8 L to 5 L nasal cannula today.  No other questions or concerns at this time. Denies headache, no visual changes, no chest pain, no palpitations, no abdominal pain.  No other acute events overnight per nursing staff.  Objective: Vitals:   08/15/20 0300 08/15/20 0400 08/15/20 0500 08/15/20 0800  BP: (!) 114/51 (!) 114/58  (!) 110/55  Pulse: (!) 38 (!) 39 (!) 38 (!) 42  Resp: (!) 21 (!) 24 (!) 23 14  Temp:    97.7  F (36.5 C)  TempSrc:    Oral  SpO2: 96% 100% 90% 96%  Weight:      Height:        Intake/Output Summary (Last 24 hours) at 08/15/2020 1015 Last data filed at 08/14/2020 2253 Gross per 24 hour  Intake 10 ml  Output 275 ml  Net -265 ml   Filed Weights   08/11/20 2022  Weight: 116.6 kg    Examination:  General exam: Appears calm and comfortable  Respiratory system: Clear to auscultation. Respiratory effort normal.  On 5 L nasal cannula with SPO2 90% Cardiovascular system: S1 & S2 heard, RRR. No JVD, murmurs, rubs, gallops or clicks. No pedal edema. Gastrointestinal system: Abdomen is nondistended, soft and nontender. No organomegaly or masses felt. Normal bowel sounds heard. Central nervous system: Alert and oriented. No focal neurological deficits. Extremities: Symmetric 5 x 5 power. Skin: No rashes, lesions or ulcers Psychiatry: Judgement and insight appear normal. Mood & affect appropriate.     Data Reviewed: I have personally reviewed following labs and imaging studies  CBC: Recent Labs  Lab 08/11/20 1249 08/12/20 0450 08/13/20 0502 08/14/20 0515  WBC 7.9 5.9 8.6 10.8*  NEUTROABS 6.6 4.7 6.5 8.0*  HGB 12.1 11.3* 11.2* 11.2*  HCT 39.0 37.0 36.9 36.1  MCV 72.8* 72.5* 72.9* 72.6*  PLT 265 295 362 467*   Basic Metabolic  Panel: Recent Labs  Lab 08/11/20 1249 08/12/20 0450 08/13/20 1142 08/14/20 0515  NA 137 140 139 138  K 4.6 3.8 3.7 3.9  CL 100 101 100 100  CO2 24 25 26 27   GLUCOSE 108* 136* 124* 120*  BUN 7 9 13 15   CREATININE 0.67 0.55 0.65 0.58  CALCIUM 8.7* 9.1 9.4 9.2  MG  --  2.2 2.2 1.9  PHOS  --  3.1 2.9 5.4*   GFR: Estimated Creatinine Clearance: 139.4 mL/min (by C-G formula based on SCr of 0.58 mg/dL). Liver Function Tests: Recent Labs  Lab 08/11/20 1249 08/12/20 0450 08/13/20 1142 08/14/20 0515  AST 52* 46* 48* 57*  ALT 23 28 37 56*  ALKPHOS 39 43 42 37*  BILITOT 0.6 0.7 0.4 0.6  PROT 7.8 7.6 7.8 6.9  ALBUMIN 3.4* 3.2* 3.2* 3.0*     No results for input(s): LIPASE, AMYLASE in the last 168 hours. No results for input(s): AMMONIA in the last 168 hours. Coagulation Profile: No results for input(s): INR, PROTIME in the last 168 hours. Cardiac Enzymes: No results for input(s): CKTOTAL, CKMB, CKMBINDEX, TROPONINI in the last 168 hours. BNP (last 3 results) No results for input(s): PROBNP in the last 8760 hours. HbA1C: No results for input(s): HGBA1C in the last 72 hours. CBG: Recent Labs  Lab 08/14/20 0659  GLUCAP 123*   Lipid Profile: No results for input(s): CHOL, HDL, LDLCALC, TRIG, CHOLHDL, LDLDIRECT in the last 72 hours. Thyroid Function Tests: Recent Labs    08/14/20 0840  TSH 0.264*  FREET4 1.07   Anemia Panel: Recent Labs    08/13/20 1142 08/14/20 0515  FERRITIN 111 74   Sepsis Labs: Recent Labs  Lab 08/11/20 1249 08/11/20 2136  PROCALCITON <0.10  --   LATICACIDVEN 1.2 0.9    Recent Results (from the past 240 hour(s))  Blood Culture (routine x 2)     Status: None (Preliminary result)   Collection Time: 08/11/20 12:49 PM   Specimen: BLOOD  Result Value Ref Range Status   Specimen Description   Final    BLOOD SITE NOT SPECIFIED Performed at [facility], [address], [place], [place]    Special Requests   Final    BOTTLES DRAWN AEROBIC AND ANAEROBIC Blood Culture adequate volume Performed at [facility], [address], [place], [place]    Culture   Final    NO GROWTH 4 DAYS Performed at [facility], [address], [place], [address] [place]    Report Status PENDING  Incomplete  Blood Culture (routine x 2)     Status: None (Preliminary result)   Collection Time: 08/11/20  9:36 PM   Specimen: BLOOD LEFT HAND  Result Value Ref Range Status   Specimen Description   Final    BLOOD LEFT HAND Performed at [facility], [address], [place], [place]    Special Requests   Final     BOTTLES DRAWN AEROBIC ONLY Blood Culture adequate volume Performed at [facility], [address], [place], [place]    Culture   Final    NO GROWTH 3 DAYS Performed at [facility], [address] [place], [address] [place]    Report Status PENDING  Incomplete  MRSA PCR Screening     Status: None   Collection Time: 08/14/20  9:50 AM   Specimen: Nasal Mucosa; Nasopharyngeal  Result Value Ref Range  Status   MRSA by PCR NEGATIVE NEGATIVE Final    Comment:        The GeneXpert MRSA Assay (FDA approved for NASAL specimens only), is one component of a comprehensive MRSA colonization surveillance program. It is not intended to diagnose MRSA infection nor to guide or monitor treatment for MRSA infections. Performed at [facility] [address], [place], [place] [address]          Radiology Studies: DG CHEST PORT 1 VIEW  Result Date: 08/14/2020 CLINICAL DATA:  Cough, shortness of breath EXAM: PORTABLE CHEST 1 VIEW COMPARISON:  08/11/2020 FINDINGS: No significant interval change in low volume AP portable chest radiograph with bilateral heterogeneous and interstitial airspace opacity and possible small layering pleural effusions. No new or focal airspace opacity. Mild cardiomegaly. IMPRESSION: No significant interval change in low volume AP portable chest radiograph with bilateral heterogeneous and interstitial airspace opacity and possible small layering pleural effusions. No new or focal airspace opacity. Electronically Signed   By: Lauralyn Primes M.D.   On: 08/14/2020 08:14   ECHOCARDIOGRAM COMPLETE  Result Date: 08/14/2020    ECHOCARDIOGRAM REPORT   Patient Name:   [name] [name] Date of Exam: 08/14/2020 Medical Rec #:  [id number]      Height:       71.0 in Accession #:    [id number]     Weight:       257.0 lb Date of Birth:  06/07/1986      BSA:          2.346 m Patient Age:    34 years       BP:           97/82 mmHg Patient  Gender: F              HR:           41 bpm. Exam Location:  Inpatient Procedure: 2D Echo, Cardiac Doppler and Color Doppler Indications:   Dysnpea  History:       Patient has no prior history of Echocardiogram examinations.                Anemia.  Sonographer:   Elmarie Shiley Dance Referring      [phone] [name] [name] Phys: IMPRESSIONS  1. Marked sinus bradycardia. Left ventricular ejection fraction, by estimation, is 55 to 60%. The left ventricle has normal function. The left ventricle has no regional wall motion abnormalities. Left ventricular diastolic parameters were normal.  2. Right ventricular systolic function is normal. The right ventricular size is normal.  3. The pericardial effusion is posterior to the left ventricle.  4. The mitral valve is grossly normal. Trivial mitral valve regurgitation.  5. The aortic valve is tricuspid. Aortic valve regurgitation is not visualized. FINDINGS  Left Ventricle: Marked sinus bradycardia. Left ventricular ejection fraction, by estimation, is 55 to 60%. The left ventricle has normal function. The left ventricle has no regional wall motion abnormalities. The left ventricular internal cavity size was normal in size. There is no left ventricular hypertrophy. Left ventricular diastolic parameters were normal. Indeterminate filling pressures. Right Ventricle: The right ventricular size is normal. No increase in right ventricular wall thickness. Right ventricular systolic function is normal. Left Atrium: Left atrial size was normal in size. Right Atrium: Right atrial size was normal in size. Pericardium: Trivial pericardial effusion is present. The pericardial effusion is posterior to the left ventricle. Mitral Valve: The mitral valve is grossly normal. Trivial mitral valve regurgitation. Tricuspid  Valve: The tricuspid valve is grossly normal. Tricuspid valve regurgitation is trivial. Aortic Valve: The aortic valve is tricuspid. Aortic valve regurgitation is not visualized.  Pulmonic Valve: The pulmonic valve was grossly normal. Pulmonic valve regurgitation is trivial. Aorta: The aortic root and ascending aorta are structurally normal, with no evidence of dilitation. Venous: The inferior vena cava was not well visualized. IAS/Shunts: No atrial level shunt detected by color flow Doppler.  LEFT VENTRICLE PLAX 2D LVIDd:         4.70 cm  Diastology LVIDs:         3.20 cm  LV e' medial:    8.92 cm/s LV PW:         0.90 cm  LV E/e' medial:  10.0 LV IVS:        0.70 cm  LV e' lateral:   9.03 cm/s LVOT diam:     2.00 cm  LV E/e' lateral: 9.9 LV SV:         62 LV SV Index:   27 LVOT Area:     3.14 cm  RIGHT VENTRICLE             IVC RV Basal diam:  2.90 cm     IVC diam: 1.80 cm RV S prime:     16.30 cm/s TAPSE (M-mode): 3.0 cm LEFT ATRIUM             Index       RIGHT ATRIUM           Index LA diam:        3.40 cm 1.45 cm/m  RA Area:     16.70 cm LA Vol (A2C):   58.7 ml 25.02 ml/m RA Volume:   42.60 ml  18.16 ml/m LA Vol (A4C):   42.5 ml 18.12 ml/m LA Biplane Vol: 50.2 ml 21.40 ml/m  AORTIC VALVE LVOT Vmax:   75.20 cm/s LVOT Vmean:  51.100 cm/s LVOT VTI:    0.198 m  AORTA Ao Root diam: 3.10 cm Ao Asc diam:  2.50 cm MITRAL VALVE MV Area (PHT): 2.56 cm    SHUNTS MV Decel Time: 296 msec    Systemic VTI:  0.20 m MV E velocity: 89.30 cm/s  Systemic Diam: 2.00 cm MV A velocity: 59.50 cm/s MV E/A ratio:  1.50 Zoila Shutter MD Electronically signed by Zoila Shutter MD Signature Date/Time: 08/14/2020/2:13:03 PM    Final         Scheduled Meds: . albuterol  2 puff Inhalation TID  . atropine  1 mg Intravenous Once  . baricitinib  4 mg Oral Daily  . calcium-vitamin D  0.5 tablet Oral BID  . Chlorhexidine Gluconate Cloth  6 each Topical Daily  . dexamethasone (DECADRON) injection  6 mg Intravenous Q24H  . dicyclomine  10 mg Oral Once  . enoxaparin (LOVENOX) injection  60 mg Subcutaneous Q24H  . Ferrous Fumarate  1 tablet Oral QHS  . mouth rinse  15 mL Mouth Rinse BID  . multivitamins  with iron  1 tablet Oral Daily  . sodium chloride flush  10-40 mL Intracatheter Q12H  . sodium chloride flush  3 mL Intravenous Q12H   Continuous Infusions: . sodium chloride 500 mL (08/15/20 1012)  . remdesivir 100 mg in NS 100 mL Stopped (08/14/20 1153)     LOS: 4 days    Time spent: 37 minutes spent on chart review, discussion with nursing staff, consultants, updating family and interview/physical exam; more than 50% of that  time was spent in counseling and/or coordination of care.    Alvira Philips [place], DO [facility] Available via Epic secure chat 7am-7pm After these hours, please refer to coverage provider listed on [email or web address] 08/15/2020, 10:15 AM

## 2020-08-15 NOTE — Evaluation (Signed)
Physical Therapy Evaluation Patient Details Name: [name] [name] MRN: [id number] DOB: 06/24/1986 Today's Date: 08/15/2020   History of Present Illness  34 yo female admitted with COVID, bradycardia. Hx of obesity  Clinical Impression  On eval, pt was Min guard assist for mobility. She walked ~15 feet around the room. O2 87% on 3L [place] with activity during session. Will plan to follow and progress activity as tolerated. Pt reports she has 2 flights of stairs to get up to her apt.     Follow Up Recommendations Home health PT    Equipment Recommendations  None recommended by PT    Recommendations for Other Services       Precautions / Restrictions Precautions Precautions: Fall Precaution Comments: monitor O2 Restrictions Weight Bearing Restrictions: No      Mobility  Bed Mobility Overal bed mobility: Modified Independent                Transfers Overall transfer level: Needs assistance   Transfers: Sit to/from Stand Sit to Stand: Min guard         General transfer comment: for safety.  Ambulation/Gait Ambulation/Gait assistance: Min guard Gait Distance (Feet): 15 Feet Assistive device: None Gait Pattern/deviations: Step-through pattern;Decreased stride length     General Gait Details: slow, cautious gait pattern. dyspnea 3/4. O2 87% on 3L  Stairs            Wheelchair Mobility    Modified Rankin (Stroke Patients Only)       Balance Overall balance assessment: Needs assistance           Standing balance-Leahy Scale: Fair                               Pertinent Vitals/Pain Pain Assessment: 0-10 Pain Score: 5  Pain Location: haeadache, abd pain Pain Descriptors / Indicators: Discomfort;Sore Pain Intervention(s): Monitored during session    Home Living Family/patient expects to be discharged to:: Private residence Living Arrangements: Alone   Type of Home: Apartment Home Access: Stairs to enter Entrance Stairs-Rails:  Right Entrance Stairs-Number of Steps: 2 flights Home Layout: One level Home Equipment: None      Prior Function Level of Independence: Independent               Hand Dominance        Extremity/Trunk Assessment   Upper Extremity Assessment Upper Extremity Assessment: Overall WFL for tasks assessed    Lower Extremity Assessment Lower Extremity Assessment: Generalized weakness    Cervical / Trunk Assessment Cervical / Trunk Assessment: Normal  Communication   Communication: No difficulties  Cognition Arousal/Alertness: Awake/alert Behavior During Therapy: WFL for tasks assessed/performed Overall Cognitive Status: Within Functional Limits for tasks assessed                                        General Comments      Exercises     Assessment/Plan    PT Assessment Patient needs continued PT services  PT Problem List Decreased mobility;Decreased balance;Decreased activity tolerance;Cardiopulmonary status limiting activity       PT Treatment Interventions Gait training;Therapeutic activities;Therapeutic exercise;Patient/family education;Functional mobility training;Balance training    PT Goals (Current goals can be found in the Care Plan section)  Acute Rehab PT Goals Patient Stated Goal: regain plof PT Goal Formulation: With patient Time For Goal Achievement: 08/29/20  Potential to Achieve Goals: Good    Frequency Min 3X/week   Barriers to discharge        Co-evaluation               AM-PAC PT "6 Clicks" Mobility  Outcome Measure Help needed turning from your back to your side while in a flat bed without using bedrails?: None Help needed moving from lying on your back to sitting on the side of a flat bed without using bedrails?: None Help needed moving to and from a bed to a chair (including a wheelchair)?: A Little Help needed standing up from a chair using your arms (e.g., wheelchair or bedside chair)?: A Little Help needed  to walk in hospital room?: A Little Help needed climbing 3-5 steps with a railing? : A Little 6 Click Score: 20    End of Session Equipment Utilized During Treatment: Oxygen Activity Tolerance:  (limited by dyspnea) Patient left: in chair;with call bell/phone within reach;with nursing/sitter in room   PT Visit Diagnosis: Unsteadiness on feet (R26.81)    Time: 1351-1411 PT Time Calculation (min) (ACUTE ONLY): 20 min   Charges:   PT Evaluation $PT Eval Low Complexity: 1 Low           Faye Ramsay, PT Acute Rehabilitation  Office: [phone] Pager: [phone]

## 2020-08-15 NOTE — Progress Notes (Signed)
   08/14/20 0605  Assess: MEWS Score  Temp (!) 97.5 F (36.4 C)  BP 109/62  Pulse Rate (!) 41  Resp (!) 28  SpO2 90 %  Assess: MEWS Score  MEWS Temp 0  MEWS Systolic 0  MEWS Pulse 1  MEWS RR 2  MEWS LOC 0  MEWS Score 3  MEWS Score Color Yellow  Assess: if the MEWS score is Yellow or Red  Were vital signs taken at a resting state? Yes  Focused Assessment No change from prior assessment  Early Detection of Sepsis Score *See Row Information* Low  MEWS guidelines implemented *See Row Information* Yes  Treat  MEWS Interventions Administered prn meds/treatments  Pain Scale 0-10  Pain Score 0  Take Vital Signs  Increase Vital Sign Frequency  Yellow: Q 2hr X 2 then Q 4hr X 2, if remains yellow, continue Q 4hrs  Escalate  MEWS: Escalate Yellow: discuss with charge nurse/RN and consider discussing with provider and RRT  Notify: Charge Nurse/RN  Name of Charge Nurse/RN Notified Kristine RN  Date Charge Nurse/RN Notified 08/13/20  Time Charge Nurse/RN Notified 0610  Notify: Provider  Provider Name/Title Linton Flemings APP  Notify: Rapid Response  Name of Rapid Response RN Notified ALEXIS RN  Date Rapid Response Notified 08/13/20  Time Rapid Response Notified 0615  Document  Patient Outcome Transferred/level of care increased

## 2020-08-15 NOTE — Progress Notes (Signed)
Progress Note  Patient Name: [name] [name] Date of Encounter: 08/15/2020  Primary Cardiologist: New to [facility]   Subjective   No new cardiovascular complaints. Remains bradycardic, asymptomatic from that standpoint. Normal echo, trivial posterior pericardial effusion..  Inpatient Medications    Scheduled Meds: . albuterol  2 puff Inhalation TID  . atropine  1 mg Intravenous Once  . baricitinib  4 mg Oral Daily  . calcium-vitamin D  0.5 tablet Oral BID  . Chlorhexidine Gluconate Cloth  6 each Topical Daily  . dexamethasone (DECADRON) injection  6 mg Intravenous Q24H  . dicyclomine  10 mg Oral Once  . enoxaparin (LOVENOX) injection  60 mg Subcutaneous Q24H  . Ferrous Fumarate  1 tablet Oral QHS  . mouth rinse  15 mL Mouth Rinse BID  . multivitamins with iron  1 tablet Oral Daily  . sodium chloride flush  10-40 mL Intracatheter Q12H  . sodium chloride flush  3 mL Intravenous Q12H   Continuous Infusions: . sodium chloride Stopped (08/12/20 2205)  . remdesivir 100 mg in NS 100 mL Stopped (08/14/20 1153)   PRN Meds: sodium chloride, acetaminophen, guaiFENesin-dextromethorphan, ondansetron **OR** ondansetron (ZOFRAN) IV, sodium chloride flush, sodium chloride flush   Vital Signs    Vitals:   08/15/20 0200 08/15/20 0300 08/15/20 0400 08/15/20 0500  BP: (!) 118/49 (!) 114/51 (!) 114/58   Pulse: (!) 40 (!) 38 (!) 39 (!) 38  Resp: (!) 30 (!) 21 (!) 24 (!) 23  Temp:      TempSrc:      SpO2: 91% 96% 100% 90%  Weight:      Height:        Intake/Output Summary (Last 24 hours) at 08/15/2020 0746 Last data filed at 08/14/2020 2253 Gross per 24 hour  Intake 10 ml  Output 275 ml  Net -265 ml   Filed Weights   08/11/20 2022  Weight: 116.6 kg    Physical Exam   GEN: Well nourished, well developed, in no acute distress.  HEENT: Grossly normal.  Neck: Supple, no JVD, carotid bruits, or masses. Cardiac: RRR, no murmurs, rubs, or gallops. No clubbing, cyanosis, edema.   Radials/DP/PT 2+ and equal bilaterally.  Respiratory:  Respirations regular and unlabored, clear to auscultation bilaterally. GI: Soft, nontender, nondistended, BS + x 4. MS: no deformity or atrophy. Skin: warm and dry, no rash. Neuro:  Strength and sensation are intact. Psych: AAOx3.  Normal affect.  Labs    Chemistry Recent Labs  Lab 08/12/20 0450 08/13/20 1142 08/14/20 0515  NA 140 139 138  K 3.8 3.7 3.9  CL 101 100 100  CO2 25 26 27   GLUCOSE 136* 124* 120*  BUN 9 13 15   CREATININE 0.55 0.65 0.58  CALCIUM 9.1 9.4 9.2  PROT 7.6 7.8 6.9  ALBUMIN 3.2* 3.2* 3.0*  AST 46* 48* 57*  ALT 28 37 56*  ALKPHOS 43 42 37*  BILITOT 0.7 0.4 0.6  GFRNONAA >60 >60 >60  GFRAA >60 >60 >60  ANIONGAP 14 13 11      Hematology Recent Labs  Lab 08/12/20 0450 08/13/20 0502 08/14/20 0515  WBC 5.9 8.6 10.8*  RBC 5.10 5.06 4.97  HGB 11.3* 11.2* 11.2*  HCT 37.0 36.9 36.1  MCV 72.5* 72.9* 72.6*  MCH 22.2* 22.1* 22.5*  MCHC 30.5 30.4 31.0  RDW 15.6* 16.0* 15.6*  PLT 295 362 467*    Cardiac EnzymesNo results for input(s): TROPONINI in the last 168 hours. No results for input(s): TROPIPOC  in the last 168 hours.   BNPNo results for input(s): BNP, PROBNP in the last 168 hours.   DDimer  Recent Labs  Lab 08/12/20 0450 08/13/20 0502 08/14/20 0515  DDIMER 0.59* 0.50 0.34     Radiology    DG CHEST PORT 1 VIEW  Result Date: 08/14/2020 CLINICAL DATA:  Cough, shortness of breath EXAM: PORTABLE CHEST 1 VIEW COMPARISON:  08/11/2020 FINDINGS: No significant interval change in low volume AP portable chest radiograph with bilateral heterogeneous and interstitial airspace opacity and possible small layering pleural effusions. No new or focal airspace opacity. Mild cardiomegaly. IMPRESSION: No significant interval change in low volume AP portable chest radiograph with bilateral heterogeneous and interstitial airspace opacity and possible small layering pleural effusions. No new or focal airspace  opacity. Electronically Signed   By: Lauralyn PrimesAlex  Bibbey M.D.   On: 08/14/2020 08:14   ECHOCARDIOGRAM COMPLETE  Result Date: 08/14/2020    ECHOCARDIOGRAM REPORT   Patient Name:   [name] [name] Date of Exam: 08/14/2020 Medical Rec #:  [id number]      Height:       71.0 in Accession #:    [id number]     Weight:       257.0 lb Date of Birth:  Dec 11, 1985      BSA:          2.346 m Patient Age:    34 years       BP:           97/82 mmHg Patient Gender: F              HR:           41 bpm. Exam Location:  Inpatient Procedure: 2D Echo, Cardiac Doppler and Color Doppler Indications:   Dysnpea  History:       Patient has no prior history of Echocardiogram examinations.                Anemia.  Sonographer:   Elmarie Shileyiffany Dance Referring      [phone] ERIC J [place] Phys: IMPRESSIONS  1. Marked sinus bradycardia. Left ventricular ejection fraction, by estimation, is 55 to 60%. The left ventricle has normal function. The left ventricle has no regional wall motion abnormalities. Left ventricular diastolic parameters were normal.  2. Right ventricular systolic function is normal. The right ventricular size is normal.  3. The pericardial effusion is posterior to the left ventricle.  4. The mitral valve is grossly normal. Trivial mitral valve regurgitation.  5. The aortic valve is tricuspid. Aortic valve regurgitation is not visualized. FINDINGS  Left Ventricle: Marked sinus bradycardia. Left ventricular ejection fraction, by estimation, is 55 to 60%. The left ventricle has normal function. The left ventricle has no regional wall motion abnormalities. The left ventricular internal cavity size was normal in size. There is no left ventricular hypertrophy. Left ventricular diastolic parameters were normal. Indeterminate filling pressures. Right Ventricle: The right ventricular size is normal. No increase in right ventricular wall thickness. Right ventricular systolic function is normal. Left Atrium: Left atrial size was normal in size. Right  Atrium: Right atrial size was normal in size. Pericardium: Trivial pericardial effusion is present. The pericardial effusion is posterior to the left ventricle. Mitral Valve: The mitral valve is grossly normal. Trivial mitral valve regurgitation. Tricuspid Valve: The tricuspid valve is grossly normal. Tricuspid valve regurgitation is trivial. Aortic Valve: The aortic valve is tricuspid. Aortic valve regurgitation is not visualized. Pulmonic Valve: The pulmonic valve was grossly normal. Pulmonic valve  regurgitation is trivial. Aorta: The aortic root and ascending aorta are structurally normal, with no evidence of dilitation. Venous: The inferior vena cava was not well visualized. IAS/Shunts: No atrial level shunt detected by color flow Doppler.  LEFT VENTRICLE PLAX 2D LVIDd:         4.70 cm  Diastology LVIDs:         3.20 cm  LV e' medial:    8.92 cm/s LV PW:         0.90 cm  LV E/e' medial:  10.0 LV IVS:        0.70 cm  LV e' lateral:   9.03 cm/s LVOT diam:     2.00 cm  LV E/e' lateral: 9.9 LV SV:         62 LV SV Index:   27 LVOT Area:     3.14 cm  RIGHT VENTRICLE             IVC RV Basal diam:  2.90 cm     IVC diam: 1.80 cm RV S prime:     16.30 cm/s TAPSE (M-mode): 3.0 cm LEFT ATRIUM             Index       RIGHT ATRIUM           Index LA diam:        3.40 cm 1.45 cm/m  RA Area:     16.70 cm LA Vol (A2C):   58.7 ml 25.02 ml/m RA Volume:   42.60 ml  18.16 ml/m LA Vol (A4C):   42.5 ml 18.12 ml/m LA Biplane Vol: 50.2 ml 21.40 ml/m  AORTIC VALVE LVOT Vmax:   75.20 cm/s LVOT Vmean:  51.100 cm/s LVOT VTI:    0.198 m  AORTA Ao Root diam: 3.10 cm Ao Asc diam:  2.50 cm MITRAL VALVE MV Area (PHT): 2.56 cm    SHUNTS MV Decel Time: 296 msec    Systemic VTI:  0.20 m MV E velocity: 89.30 cm/s  Systemic Diam: 2.00 cm MV A velocity: 59.50 cm/s MV E/A ratio:  1.50 Zoila Shutter MD Electronically signed by Zoila Shutter MD Signature Date/Time: 08/14/2020/2:13:03 PM    Final    Telemetry    08/15/20 SB with HR's in the  35-45bpm range  - Personally Reviewed  ECG    No new tracing as of 08/15/20 - Personally Reviewed  Cardiac Studies   Echocardiogram 08/14/20:  1. Marked sinus bradycardia. Left ventricular ejection fraction, by  estimation, is 55 to 60%. The left ventricle has normal function. The left  ventricle has no regional wall motion abnormalities. Left ventricular  diastolic parameters were normal.  2. Right ventricular systolic function is normal. The right ventricular  size is normal.  3. The pericardial effusion is posterior to the left ventricle.  4. The mitral valve is grossly normal. Trivial mitral valve  regurgitation.  5. The aortic valve is tricuspid. Aortic valve regurgitation is not  visualized.   Patient Profile     34 y.o. female with a hx of multinodular goiter, moderate obesity, iron deficiency anemia admitted for COVID-19 pneumonia who is being seen today for the evaluation of bradycardia at the request of Dr. [place].  Assessment & Plan    1. Sinus bradycardia: -Felt to have some degree of bradycardia at baseline on review of OV notes in CareEverywhere (typical HR ranges in the 50-60bpm). Plan was to obtain echocardiogram for structural evaluation. This showed a preserved LVEF at 55-60%  with no RWMA and normal diastolic parameters. There was evidence of pericardial effusion, posterior and to the left ventricle. No valvular disease noted.  Given normal echo, will continue to monitor bradycardia with no intervention unless pt becomes symptomatic  -Telemetry shows persistent bradycardia with rates in the 35-45bpm range   Signed, Georgie Chard NP-C [facility] Pager: [phone] 08/15/2020, 7:46 AM     For questions or updates, please contact   Please consult [email or web address] for contact info under Cardiology/STEMI.  I have seen and examined the patient along with Georgie Chard NP-C.  I have reviewed the chart, notes and new data.  I agree with their note.  Key new  complaints: no syncope, dizziness, angina, change in dyspnea Key examination changes: no pericardial rub, bradycardic. Otw normal CV exam Key new findings / data: echo reviewed. The pericardial effusion may be physiological.  PLAN:  Bradycardia and the anterior T wave inversions remain the only CV abnormalities, with reassuring workup. No specific therapy is indicated for the bradycardia or the trivial pericardial effusion. Continue general supportive care for COVID-19 infection. Monitor ECG periodically for changes in the repolarization abnormalities (will recheck on 9/16) and arrange outpatient Cardiology follow-up about 3 weeks after hospital DC.  Thurmon Fair, MD, [facility] [phone] 08/15/2020, 1:58 PM

## 2020-08-16 DIAGNOSIS — J9601 Acute respiratory failure with hypoxia: Secondary | ICD-10-CM

## 2020-08-16 LAB — COMPREHENSIVE METABOLIC PANEL
ALT: 55 U/L — ABNORMAL HIGH (ref 0–44)
AST: 29 U/L (ref 15–41)
Albumin: 2.8 g/dL — ABNORMAL LOW (ref 3.5–5.0)
Alkaline Phosphatase: 37 U/L — ABNORMAL LOW (ref 38–126)
Anion gap: 12 (ref 5–15)
BUN: 11 mg/dL (ref 6–20)
CO2: 27 mmol/L (ref 22–32)
Calcium: 9.1 mg/dL (ref 8.9–10.3)
Chloride: 100 mmol/L (ref 98–111)
Creatinine, Ser: 0.53 mg/dL (ref 0.44–1.00)
GFR calc Af Amer: >60 mL/min (ref >60)
GFR calc non Af Amer: >60 mL/min (ref >60)
Glucose, Bld: 101 mg/dL — ABNORMAL HIGH (ref 70–99)
Potassium: 4.2 mmol/L (ref 3.5–5.1)
Sodium: 139 mmol/L (ref 135–145)
Total Bilirubin: 0.4 mg/dL (ref 0.3–1.2)
Total Protein: 6.3 g/dL — ABNORMAL LOW (ref 6.5–8.1)

## 2020-08-16 LAB — CBC WITH DIFFERENTIAL/PLATELET
Abs Immature Granulocytes: 0.18 10*3/uL — ABNORMAL HIGH (ref 0.00–0.07)
Basophils Absolute: 0 10*3/uL (ref 0.0–0.1)
Basophils Relative: 0 %
Eosinophils Absolute: 0 10*3/uL (ref 0.0–0.5)
Eosinophils Relative: 0 %
HCT: 34.1 % — ABNORMAL LOW (ref 36.0–46.0)
Hemoglobin: 10.4 g/dL — ABNORMAL LOW (ref 12.0–15.0)
Immature Granulocytes: 1 %
Lymphocytes Relative: 15 %
Lymphs Abs: 2 10*3/uL (ref 0.7–4.0)
MCH: 22.5 pg — ABNORMAL LOW (ref 26.0–34.0)
MCHC: 30.5 g/dL (ref 30.0–36.0)
MCV: 73.7 fL — ABNORMAL LOW (ref 80.0–100.0)
Monocytes Absolute: 1 10*3/uL (ref 0.1–1.0)
Monocytes Relative: 8 %
Neutro Abs: 10 10*3/uL — ABNORMAL HIGH (ref 1.7–7.7)
Neutrophils Relative %: 76 %
Platelets: 462 10*3/uL — ABNORMAL HIGH (ref 150–400)
RBC: 4.63 MIL/uL (ref 3.87–5.11)
RDW: 15.9 % — ABNORMAL HIGH (ref 11.5–15.5)
WBC: 13.2 10*3/uL — ABNORMAL HIGH (ref 4.0–10.5)
nRBC: 0 % (ref 0.0–0.2)

## 2020-08-16 LAB — CULTURE, BLOOD (ROUTINE X 2)
Culture: NO GROWTH
Special Requests: ADEQUATE

## 2020-08-16 LAB — MAGNESIUM: Magnesium: 2 mg/dL (ref 1.7–2.4)

## 2020-08-16 LAB — PHOSPHORUS: Phosphorus: 4.5 mg/dL (ref 2.5–4.6)

## 2020-08-16 LAB — FERRITIN: Ferritin: 70 ng/mL (ref 11–307)

## 2020-08-16 LAB — C-REACTIVE PROTEIN: CRP: 2.2 mg/dL — ABNORMAL HIGH (ref <1.0)

## 2020-08-16 MED ORDER — SALINE SPRAY 0.65 % NA SOLN
1.0000 | NASAL | Status: DC | PRN
  Filled 2020-08-16: qty 44

## 2020-08-16 NOTE — Progress Notes (Signed)
No new cardiac events. Remains mildly bradycardic, asymptomatic. Recheck ECG in AM to see if repolarization changes are evolving or more chronic. No new treatment recommendations.

## 2020-08-16 NOTE — Progress Notes (Signed)
PROGRESS NOTE    [name] [name]  YWV:[id number] DOB: 1986/10/07 DOA: 08/11/2020 PCP: Burnis Medin, PA-C    Brief Narrative:  34 year old with history of morbid obesity, symptomatic since 9/6, tested positive on 9/7 for COVID-19 came to the ER on 9/10 with worsening shortness of breath.  In the emergency room, oxygen saturation 84%.  Initially required 4 L of oxygen.  Admitted to the hospital due to significant symptoms.   Assessment & Plan:   Principal Problem:   Pneumonia due to COVID-19 virus Active Problems:   Acute respiratory failure due to COVID-19 (HCC)   Obesity (BMI 30-39.9)   Sinus bradycardia   Abnormal ECG  Pneumonia due to COVID-19 virus infection, acute hypoxemic respiratory failure: Continue to monitor due to significant symptoms  chest physiotherapy, incentive spirometry, deep breathing exercises, sputum induction, mucolytic's and bronchodilators. Supplemental oxygen to keep saturations more than 90%. Covid directed therapy with , steroids, remains on dexamethasone day 6/10 remdesivir, day 5/5 Baricitinib day 3/14 or until hospital discharge. Due to severity of symptoms, patient will need daily inflammatory markers, chest x-rays, liver function test to monitor and direct COVID-19 therapies. Still with significant symptoms.  Mobilize.  SpO2: 95 % O2 Flow Rate (L/min): 2 L/min  COVID-19 Labs  Recent Labs    08/14/20 0515 08/15/20 1130 08/16/20 0506  DDIMER 0.34  --   --   FERRITIN 74 74 70  CRP 3.1* 1.9* 2.2*    No results found for: SARSCOV2NAA tested positive outside on 9/7.   Sinus bradycardia: Seen by cardiology.  Heart rate fairly stable.  No need for intervention.  They will schedule outpatient follow-up.  Echocardiogram normal.  TSH fairly normal.  Obesity: BMI 35.  She will benefit with ongoing weight loss and exercise.  DVT prophylaxis: Lovenox subcu.   Code Status: Full code Family Communication: dad on the phone Disposition  Plan: Status is: Inpatient  Remains inpatient appropriate because:Inpatient level of care appropriate due to severity of illness   Dispo:  Patient From: Home  Planned Disposition: Home  Expected discharge date: 08/17/20  Medically stable for discharge: No      Consultants:   Cardiology  Procedures:   None  Antimicrobials:  Anti-infectives (From admission, onward)   Start     Dose/Rate Route Frequency Ordered Stop   08/12/20 1000  remdesivir 100 mg in sodium chloride 0.9 % 100 mL IVPB       "Followed by" Linked Group Details   100 mg 200 mL/hr over 30 Minutes Intravenous Daily 08/11/20 1338 08/15/20 1047   08/12/20 1000  remdesivir 100 mg in sodium chloride 0.9 % 100 mL IVPB  Status:  Discontinued       "Followed by" Linked Group Details   100 mg 200 mL/hr over 30 Minutes Intravenous Daily 08/11/20 1349 08/11/20 1351   08/11/20 1430  remdesivir 200 mg in sodium chloride 0.9% 250 mL IVPB       "Followed by" Linked Group Details   200 mg 580 mL/hr over 30 Minutes Intravenous Once 08/11/20 1338 08/11/20 1527   08/11/20 1400  remdesivir 200 mg in sodium chloride 0.9% 250 mL IVPB  Status:  Discontinued       "Followed by" Linked Group Details   200 mg 580 mL/hr over 30 Minutes Intravenous Once 08/11/20 1349 08/11/20 1351         Subjective: Patient seen and examined.  No overnight events.  On 2 to 3 L of oxygen.  Still has difficulty taking deep  breathing otherwise fairly stable. Telemetry shows sinus rhythm most of the heart rate more than 45, occasionally 37-38.  Objective: Vitals:   08/16/20 0500 08/16/20 0512 08/16/20 0823 08/16/20 0930  BP:  (!) 105/51  (!) 101/48  Pulse: (!) 39   (!) 47  Resp: (!) 24   13  Temp:   97.8 F (36.6 C)   TempSrc:   Oral   SpO2: 98%   95%  Weight:      Height:        Intake/Output Summary (Last 24 hours) at 08/16/2020 1137 Last data filed at 08/16/2020 0500 Gross per 24 hour  Intake --  Output 1250 ml  Net -1250 ml    Filed Weights   08/11/20 2022  Weight: 116.6 kg    Examination:  General exam: Appears calm and comfortable  She is on 2 L oxygen. Respiratory system: Clear to auscultation. Respiratory effort normal.  No added sounds. Cardiovascular system: S1 & S2 heard, RRR. No JVD, murmurs, rubs, gallops or clicks. No pedal edema. Gastrointestinal system: Abdomen is nondistended, soft and nontender. No organomegaly or masses felt. Normal bowel sounds heard.  Obese and pendulous. Central nervous system: Alert and oriented. No focal neurological deficits. Extremities: Symmetric 5 x 5 power. Skin: No rashes, lesions or ulcers Psychiatry: Judgement and insight appear normal. Mood & affect appropriate.     Data Reviewed: I have personally reviewed following labs and imaging studies  CBC: Recent Labs  Lab 08/12/20 0450 08/13/20 0502 08/14/20 0515 08/15/20 1130 08/16/20 0506  WBC 5.9 8.6 10.8* 11.9* 13.2*  NEUTROABS 4.7 6.5 8.0* 8.4* 10.0*  HGB 11.3* 11.2* 11.2* 10.3* 10.4*  HCT 37.0 36.9 36.1 32.7* 34.1*  MCV 72.5* 72.9* 72.6* 73.6* 73.7*  PLT 295 362 467* 483* 462*   Basic Metabolic Panel: Recent Labs  Lab 08/12/20 0450 08/13/20 1142 08/14/20 0515 08/15/20 1130 08/16/20 0506  NA 140 139 138 140 139  K 3.8 3.7 3.9 3.6 4.2  CL 101 100 100 99 100  CO2 25 26 27 27 27   GLUCOSE 136* 124* 120* 103* 101*  BUN 9 13 15 14 11   CREATININE 0.55 0.65 0.58 0.64 0.53  CALCIUM 9.1 9.4 9.2 9.1 9.1  MG 2.2 2.2 1.9 1.9 2.0  PHOS 3.1 2.9 5.4* 3.4 4.5   GFR: Estimated Creatinine Clearance: 139.4 mL/min (by C-G formula based on SCr of 0.53 mg/dL). Liver Function Tests: Recent Labs  Lab 08/12/20 0450 08/13/20 1142 08/14/20 0515 08/15/20 1130 08/16/20 0506  AST 46* 48* 57* 41 29  ALT 28 37 56* 61* 55*  ALKPHOS 43 42 37* 38 37*  BILITOT 0.7 0.4 0.6 0.5 0.4  PROT 7.6 7.8 6.9 6.5 6.3*  ALBUMIN 3.2* 3.2* 3.0* 2.7* 2.8*   No results for input(s): LIPASE, AMYLASE in the last 168 hours. No  results for input(s): AMMONIA in the last 168 hours. Coagulation Profile: No results for input(s): INR, PROTIME in the last 168 hours. Cardiac Enzymes: No results for input(s): CKTOTAL, CKMB, CKMBINDEX, TROPONINI in the last 168 hours. BNP (last 3 results) No results for input(s): PROBNP in the last 8760 hours. HbA1C: No results for input(s): HGBA1C in the last 72 hours. CBG: Recent Labs  Lab 08/14/20 0659  GLUCAP 123*   Lipid Profile: No results for input(s): CHOL, HDL, LDLCALC, TRIG, CHOLHDL, LDLDIRECT in the last 72 hours. Thyroid Function Tests: Recent Labs    08/14/20 0840  TSH 0.264*  FREET4 1.07   Anemia Panel: Recent Labs  08/15/20 1130 08/16/20 0506  FERRITIN 74 70   Sepsis Labs: Recent Labs  Lab 08/11/20 1249 08/11/20 2136  PROCALCITON <0.10  --   LATICACIDVEN 1.2 0.9    Recent Results (from the past 240 hour(s))  Blood Culture (routine x 2)     Status: None (Preliminary result)   Collection Time: 08/11/20 12:49 PM   Specimen: BLOOD  Result Value Ref Range Status   Specimen Description   Final    BLOOD SITE NOT SPECIFIED Performed at [facility], [address], [place], [place] [address]    Special Requests   Final    BOTTLES DRAWN AEROBIC AND ANAEROBIC Blood Culture adequate volume Performed at [facility], [address] [place], [place] [address]    Culture   Final    NO GROWTH 4 DAYS Performed at [facility], [address], [place], [place] [address]    Report Status PENDING  Incomplete  Blood Culture (routine x 2)     Status: None (Preliminary result)   Collection Time: 08/11/20  9:36 PM   Specimen: BLOOD LEFT HAND  Result Value Ref Range Status   Specimen Description   Final    BLOOD LEFT HAND Performed at [facility], [address] [place], [place] [address]    Special Requests   Final    BOTTLES DRAWN AEROBIC ONLY Blood Culture adequate volume Performed at  [facility], [address], [place], [place] [address]    Culture   Final    NO GROWTH 3 DAYS Performed at [facility], [address] [place], [place] [address]    Report Status PENDING  Incomplete  MRSA PCR Screening     Status: None   Collection Time: 08/14/20  9:50 AM   Specimen: Nasal Mucosa; Nasopharyngeal  Result Value Ref Range Status   MRSA by PCR NEGATIVE NEGATIVE Final    Comment:        The GeneXpert MRSA Assay (FDA approved for NASAL specimens only), is one component of a comprehensive MRSA colonization surveillance program. It is not intended to diagnose MRSA infection nor to guide or monitor treatment for MRSA infections. Performed at [facility], [address] [place], [place] [address]          Radiology Studies: ECHOCARDIOGRAM COMPLETE  Result Date: 08/14/2020    ECHOCARDIOGRAM REPORT   Patient Name:   JAZMYNN PHO Date of Exam: 08/14/2020 Medical Rec #:  [id number]      Height:       71.0 in Accession #:    [id number]     Weight:       257.0 lb Date of Birth:  Apr 16, 1986      BSA:          2.346 m Patient Age:    34 years       BP:           97/82 mmHg Patient Gender: F              HR:           41 bpm. Exam Location:  Inpatient Procedure: 2D Echo, Cardiac Doppler and Color Doppler Indications:   Dysnpea  History:       Patient has no prior history of Echocardiogram examinations.                Anemia.  Sonographer:   Advertising copywriter Referring  [id number] ERIC J [place] Phys: IMPRESSIONS  1. Marked sinus bradycardia. Left ventricular ejection fraction, by estimation, is 55 to 60%. The left ventricle has normal function. The left ventricle has no regional wall motion abnormalities. Left ventricular diastolic parameters were normal.  2. Right ventricular systolic function is normal. The right ventricular size is normal.  3. The pericardial effusion is posterior to the left ventricle.  4. The mitral valve is grossly  normal. Trivial mitral valve regurgitation.  5. The aortic valve is tricuspid. Aortic valve regurgitation is not visualized. FINDINGS  Left Ventricle: Marked sinus bradycardia. Left ventricular ejection fraction, by estimation, is 55 to 60%. The left ventricle has normal function. The left ventricle has no regional wall motion abnormalities. The left ventricular internal cavity size was normal in size. There is no left ventricular hypertrophy. Left ventricular diastolic parameters were normal. Indeterminate filling pressures. Right Ventricle: The right ventricular size is normal. No increase in right ventricular wall thickness. Right ventricular systolic function is normal. Left Atrium: Left atrial size was normal in size. Right Atrium: Right atrial size was normal in size. Pericardium: Trivial pericardial effusion is present. The pericardial effusion is posterior to the left ventricle. Mitral Valve: The mitral valve is grossly normal. Trivial mitral valve regurgitation. Tricuspid Valve: The tricuspid valve is grossly normal. Tricuspid valve regurgitation is trivial. Aortic Valve: The aortic valve is tricuspid. Aortic valve regurgitation is not visualized. Pulmonic Valve: The pulmonic valve was grossly normal. Pulmonic valve regurgitation is trivial. Aorta: The aortic root and ascending aorta are structurally normal, with no evidence of dilitation. Venous: The inferior vena cava was not well visualized. IAS/Shunts: No atrial level shunt detected by color flow Doppler.  LEFT VENTRICLE PLAX 2D LVIDd:         4.70 cm  Diastology LVIDs:         3.20 cm  LV e' medial:    8.92 cm/s LV PW:         0.90 cm  LV E/e' medial:  10.0 LV IVS:        0.70 cm  LV e' lateral:   9.03 cm/s LVOT diam:     2.00 cm  LV E/e' lateral: 9.9 LV SV:         62 LV SV Index:   27 LVOT Area:     3.14 cm  RIGHT VENTRICLE             IVC RV Basal diam:  2.90 cm     IVC diam: 1.80 cm RV S prime:     16.30 cm/s TAPSE (M-mode): 3.0 cm LEFT ATRIUM              Index       RIGHT ATRIUM           Index LA diam:        3.40 cm 1.45 cm/m  RA Area:     16.70 cm LA Vol (A2C):   58.7 ml 25.02 ml/m RA Volume:   42.60 ml  18.16 ml/m LA Vol (A4C):   42.5 ml 18.12 ml/m LA Biplane Vol: 50.2 ml 21.40 ml/m  AORTIC VALVE LVOT Vmax:   75.20 cm/s LVOT Vmean:  51.100 cm/s LVOT VTI:    0.198 m  AORTA Ao Root diam: 3.10 cm Ao Asc diam:  2.50 cm MITRAL VALVE MV Area (PHT): 2.56 cm    SHUNTS MV Decel Time: 296 msec    Systemic VTI:  0.20 m MV E velocity: 89.30 cm/s  Systemic Diam: 2.00 cm MV A velocity: 59.50 cm/s MV E/A ratio:  1.50 Zoila Shutter MD Electronically signed by Zoila Shutter MD Signature Date/Time: 08/14/2020/2:13:03 PM    Final         Scheduled Meds: . albuterol  2 puff Inhalation TID  . atropine  1 mg Intravenous Once  . baricitinib  4 mg Oral Daily  . calcium-vitamin D  0.5 tablet Oral BID  . Chlorhexidine Gluconate Cloth  6 each Topical Daily  . dexamethasone (DECADRON) injection  6 mg Intravenous Q24H  . dicyclomine  10 mg Oral Once  . enoxaparin (LOVENOX) injection  60 mg Subcutaneous Q24H  . Ferrous Fumarate  1 tablet Oral QHS  . mouth rinse  15 mL Mouth Rinse BID  . multivitamins with iron  1 tablet Oral Daily  . sodium chloride flush  10-40 mL Intracatheter Q12H  . sodium chloride flush  3 mL Intravenous Q12H   Continuous Infusions: . sodium chloride 500 mL (08/15/20 1012)     LOS: 5 days    Time spent: 30 minutes    Dorcas Carrow, MD [facility] Pager [phone]

## 2020-08-16 NOTE — TOC Initial Note (Signed)
Transition of Care [facility]) - Initial/Assessment Note    Patient Details  Name: [name] [name] MRN: [id number] Date of Birth: [birth year]  Transition of [facility]) CM/SW Contact:    Golda Acre, RN Phone Number: 08/16/2020, 10:14 AM  Clinical Narrative:                  34 year old female with past medical history of morbid obesity who started having symptoms of cough and mild shortness of breath starting on 9/6, but then tested positive for Covid on 9/7 and then came into the emergency room on 9/10 for worsening shortness of breath.   In the ED, she was found to have an oxygen saturation of 84%. She required 4 L nasal cannula. Initial lab work noteworthy for CRP of 15, lactic acid level of only 1.2 and chest x-ray noting bilateral mid and lower lung opacities consistent with multifocal pneumonia. Patient is unvaccinated. Admitted to the hospitalist service. Started on IV Decadron and Remdisivir. Unvaccinated Plan per pt home with hhc Expected Discharge Plan: Home w Home Health Services Barriers to Discharge: Continued Medical Work up   Patient Goals and CMS Choice Patient states their goals for this hospitalization and ongoing recovery are:: to go home CMS Medicare.gov Compare Post Acute Care list provided to:: Patient    Expected Discharge Plan and Services Expected Discharge Plan: Home w Home Health Services   Discharge Planning Services: CM Consult   Living arrangements for the past 2 months: Single Family Home                                      Prior Living Arrangements/Services Living arrangements for the past 2 months: Single Family Home Lives with:: Self Patient language and need for interpreter reviewed:: Yes Do you feel safe going back to the place where you live?: Yes      Need for Family Participation in Patient Care: Yes (Comment) Care giver support system in place?: Yes (comment)   Criminal Activity/Legal Involvement Pertinent to Current  Situation/Hospitalization: No - Comment as needed  Activities of Daily Living Home Assistive Devices/Equipment: None ADL Screening (condition at time of admission) Patient's cognitive ability adequate to safely complete daily activities?: Yes Is the patient deaf or have difficulty hearing?: No Does the patient have difficulty seeing, even when wearing glasses/contacts?: No Does the patient have difficulty concentrating, remembering, or making decisions?: No Patient able to express need for assistance with ADLs?: Yes Does the patient have difficulty dressing or bathing?: No Independently performs ADLs?: Yes (appropriate for developmental age) Does the patient have difficulty walking or climbing stairs?: Yes (secondary to shortness of breath) Weakness of Legs: Both Weakness of Arms/Hands: None  Permission Sought/Granted                  Emotional Assessment Appearance:: Appears stated age     Orientation: : Oriented to Self, Oriented to Place, Oriented to  Time, Oriented to Situation Alcohol / Substance Use: Not Applicable Psych Involvement: No (comment)  Admission diagnosis:  Acute hypoxemic respiratory failure due to COVID-19 (HCC) [U07.1, J96.01] Pneumonia due to COVID-19 virus [U07.1, J12.82] Patient Active Problem List   Diagnosis Date Noted  . Sinus bradycardia   . Abnormal ECG   . Obesity (BMI 30-39.9) 08/12/2020  . Pneumonia due to COVID-19 virus 08/11/2020  . Acute respiratory failure due to COVID-19 (HCC) 08/11/2020  . Appendicitis, acute 02/25/2012  .  Vaginal bleeding 02/14/2012  . Renal insufficiency 02/14/2012  . Pleural effusion 02/14/2012  . Anemia 02/10/2012  . Ileus following gastrointestinal surgery (HCC) 02/03/2012   PCP:  Burnis Medin, PA-C Pharmacy:   [facility] [address], [place] - [address] [place] [address] Phone: [phone] Fax: [phone]     Social Determinants of Health  (SDOH) Interventions    Readmission Risk Interventions No flowsheet data found.

## 2020-08-17 LAB — CULTURE, BLOOD (ROUTINE X 2)
Culture: NO GROWTH
Special Requests: ADEQUATE

## 2020-08-17 MED ORDER — ALBUTEROL SULFATE HFA 108 (90 BASE) MCG/ACT IN AERS
2.0000 | INHALATION_SPRAY | Freq: Two times a day (BID) | RESPIRATORY_TRACT | Status: DC
  Administered 2020-08-18: 2 via RESPIRATORY_TRACT

## 2020-08-17 MED ORDER — DEXAMETHASONE 2 MG PO TABS
6.0000 mg | ORAL_TABLET | Freq: Every day | ORAL | Status: DC
  Administered 2020-08-18: 6 mg via ORAL
  Filled 2020-08-17: qty 3

## 2020-08-17 NOTE — Progress Notes (Signed)
Bradycardia is less severe (more in 50s than 40s, at least while awake). No symptoms of bradycardia. Repeat ECg shows improvement, near-resolution of the anterior T wave inversions. Best guess is that the bradycardia was related to some degree of myocarditis that allso caused the ECG changes. In the absence of symptoms and with normal LVEF and no pericardial effusion, no specific therapy is indicated. Expect resolution without sequelae.  [facility] will sign off.   Medication Recommendations:  n/a (except avoid medications that worsen bradycardia) Other recommendations (labs, testing, etc):  n/a Follow up as an outpatient:  n/a  Please call back for additional CV issues.  Thurmon Fair, MD, [facility] [phone] office [phone] pager

## 2020-08-17 NOTE — Progress Notes (Signed)
PROGRESS NOTE    [name] [name]  MVH:[id number] DOB: November 20, 1986 DOA: 08/11/2020 PCP: Burnis Medin, PA-C    Brief Narrative:  34 year old with history of morbid obesity, symptomatic since 9/6, tested positive on 9/7 for COVID-19 came to the ER on 9/10 with worsening shortness of breath.  In the emergency room, oxygen saturation 84%.  Initially required 4 L of oxygen.  Admitted to the hospital due to significant symptoms.   Assessment & Plan:   Principal Problem:   Pneumonia due to COVID-19 virus Active Problems:   Acute respiratory failure due to COVID-19 (HCC)   Obesity (BMI 30-39.9)   Sinus bradycardia   Abnormal ECG  Pneumonia due to COVID-19 virus infection, acute hypoxemic respiratory failure: Continue to monitor due to significant symptoms  chest physiotherapy, incentive spirometry, deep breathing exercises, sputum induction, mucolytic's and bronchodilators. Supplemental oxygen to keep saturations more than 90%. Covid directed therapy with , steroids, remains on dexamethasone day 7/10, changed to oral for total 10 days. remdesivir, day 5/5, completed therapy. Baricitinib day 4/14 or until hospital discharge. Due to severity of symptoms, patient will need daily inflammatory markers, chest x-rays, liver function test to monitor and direct COVID-19 therapies.  SpO2: 95 % O2 Flow Rate (L/min): 2 L/min  COVID-19 Labs  Recent Labs    08/15/20 1130 08/16/20 0506  FERRITIN 74 70  CRP 1.9* 2.2*    No results found for: SARSCOV2NAA tested positive outside on 9/7.   Sinus bradycardia: Seen by cardiology.  Heart rate fairly stable.  No need for intervention.  They will schedule outpatient follow-up.  Echocardiogram normal.  TSH fairly normal.  Obesity: BMI 35.  She will benefit with ongoing weight loss and exercise.  DVT prophylaxis: Lovenox subcu.   Code Status: Full code Family Communication: dad on the phone Disposition Plan: Status is:  Inpatient  Remains inpatient appropriate because:Inpatient level of care appropriate due to severity of illness   Dispo:  Patient From: Home  Planned Disposition: Home  Expected discharge date: 9/17  medically stable for discharge: No  Patient is still on 2 L oxygen.  She needs to mobilize more.  Lives in third floor with 3 flight of stairs to her place. Will encourage more mobility before discharging.  Anticipate discharge tomorrow.    Consultants:   Cardiology  Procedures:   None  Antimicrobials:  Anti-infectives (From admission, onward)   Start     Dose/Rate Route Frequency Ordered Stop   08/12/20 1000  remdesivir 100 mg in sodium chloride 0.9 % 100 mL IVPB       "Followed by" Linked Group Details   100 mg 200 mL/hr over 30 Minutes Intravenous Daily 08/11/20 1338 08/15/20 1047   08/12/20 1000  remdesivir 100 mg in sodium chloride 0.9 % 100 mL IVPB  Status:  Discontinued       "Followed by" Linked Group Details   100 mg 200 mL/hr over 30 Minutes Intravenous Daily 08/11/20 1349 08/11/20 1351   08/11/20 1430  remdesivir 200 mg in sodium chloride 0.9% 250 mL IVPB       "Followed by" Linked Group Details   200 mg 580 mL/hr over 30 Minutes Intravenous Once 08/11/20 1338 08/11/20 1527   08/11/20 1400  remdesivir 200 mg in sodium chloride 0.9% 250 mL IVPB  Status:  Discontinued       "Followed by" Linked Group Details   200 mg 580 mL/hr over 30 Minutes Intravenous Once 08/11/20 1349 08/11/20 1351  Subjective: Seen and examined.  No overnight events.  On 2 L oxygen at rest.  Denies any chest pain.  Breathing better than yesterday.  Very nervous about going home as she has to go 3 flight of stairs.  Objective: Vitals:   08/17/20 0600 08/17/20 0657 08/17/20 0700 08/17/20 0800  BP:  105/80    Pulse: (!) 39  (!) 47   Resp: 20  14   Temp:    97.9 F (36.6 C)  TempSrc:    Oral  SpO2: 99%  95%   Weight:      Height:        Intake/Output Summary (Last 24  hours) at 08/17/2020 1056 Last data filed at 08/16/2020 2149 Gross per 24 hour  Intake --  Output 250 ml  Net -250 ml   Filed Weights   08/11/20 2022  Weight: 116.6 kg    Examination:  General exam: Appears calm and comfortable  She is on 2 L oxygen. Respiratory system: Clear to auscultation. Respiratory effort normal.  No added sounds. Cardiovascular system: S1 & S2 heard, RRR. No JVD, murmurs, rubs, gallops or clicks. No pedal edema. Gastrointestinal system: Abdomen is nondistended, soft and nontender. No organomegaly or masses felt. Normal bowel sounds heard.  Obese and pendulous. Central nervous system: Alert and oriented. No focal neurological deficits. Extremities: Symmetric 5 x 5 power. Skin: No rashes, lesions or ulcers Psychiatry: Judgement and insight appear normal. Mood & affect appropriate.     Data Reviewed: I have personally reviewed following labs and imaging studies  CBC: Recent Labs  Lab 08/12/20 0450 08/13/20 0502 08/14/20 0515 08/15/20 1130 08/16/20 0506  WBC 5.9 8.6 10.8* 11.9* 13.2*  NEUTROABS 4.7 6.5 8.0* 8.4* 10.0*  HGB 11.3* 11.2* 11.2* 10.3* 10.4*  HCT 37.0 36.9 36.1 32.7* 34.1*  MCV 72.5* 72.9* 72.6* 73.6* 73.7*  PLT 295 362 467* 483* 462*   Basic Metabolic Panel: Recent Labs  Lab 08/12/20 0450 08/13/20 1142 08/14/20 0515 08/15/20 1130 08/16/20 0506  NA 140 139 138 140 139  K 3.8 3.7 3.9 3.6 4.2  CL 101 100 100 99 100  CO2 25 26 27 27 27   GLUCOSE 136* 124* 120* 103* 101*  BUN 9 13 15 14 11   CREATININE 0.55 0.65 0.58 0.64 0.53  CALCIUM 9.1 9.4 9.2 9.1 9.1  MG 2.2 2.2 1.9 1.9 2.0  PHOS 3.1 2.9 5.4* 3.4 4.5   GFR: Estimated Creatinine Clearance: 139.4 mL/min (by C-G formula based on SCr of 0.53 mg/dL). Liver Function Tests: Recent Labs  Lab 08/12/20 0450 08/13/20 1142 08/14/20 0515 08/15/20 1130 08/16/20 0506  AST 46* 48* 57* 41 29  ALT 28 37 56* 61* 55*  ALKPHOS 43 42 37* 38 37*  BILITOT 0.7 0.4 0.6 0.5 0.4  PROT 7.6  7.8 6.9 6.5 6.3*  ALBUMIN 3.2* 3.2* 3.0* 2.7* 2.8*   No results for input(s): LIPASE, AMYLASE in the last 168 hours. No results for input(s): AMMONIA in the last 168 hours. Coagulation Profile: No results for input(s): INR, PROTIME in the last 168 hours. Cardiac Enzymes: No results for input(s): CKTOTAL, CKMB, CKMBINDEX, TROPONINI in the last 168 hours. BNP (last 3 results) No results for input(s): PROBNP in the last 8760 hours. HbA1C: No results for input(s): HGBA1C in the last 72 hours. CBG: Recent Labs  Lab 08/14/20 0659  GLUCAP 123*   Lipid Profile: No results for input(s): CHOL, HDL, LDLCALC, TRIG, CHOLHDL, LDLDIRECT in the last 72 hours. Thyroid Function Tests: No results  for input(s): TSH, T4TOTAL, FREET4, T3FREE, THYROIDAB in the last 72 hours. Anemia Panel: Recent Labs    08/15/20 1130 08/16/20 0506  FERRITIN 74 70   Sepsis Labs: Recent Labs  Lab 08/11/20 1249 08/11/20 2136  PROCALCITON <0.10  --   LATICACIDVEN 1.2 0.9    Recent Results (from the past 240 hour(s))  Blood Culture (routine x 2)     Status: None   Collection Time: 08/11/20 12:49 PM   Specimen: BLOOD  Result Value Ref Range Status   Specimen Description   Final    BLOOD SITE NOT SPECIFIED Performed at [facility], [address], [place], [place] [address]    Special Requests   Final    BOTTLES DRAWN AEROBIC AND ANAEROBIC Blood Culture adequate volume Performed at [facility], [address] [place], [place] [address]    Culture   Final    NO GROWTH 5 DAYS Performed at [facility], [address], [place], [place] [address]    Report Status 08/16/2020 FINAL  Final  Blood Culture (routine x 2)     Status: None (Preliminary result)   Collection Time: 08/11/20  9:36 PM   Specimen: BLOOD LEFT HAND  Result Value Ref Range Status   Specimen Description   Final    BLOOD LEFT HAND Performed at [facility], [address], [place], [place] [address]    Special Requests   Final    BOTTLES DRAWN AEROBIC ONLY Blood Culture adequate volume Performed at [facility], [address], [place], [place] [address]    Culture   Final    NO GROWTH 4 DAYS Performed at [facility], [address], [place], [place] [address]    Report Status PENDING  Incomplete  MRSA PCR Screening     Status: None   Collection Time: 08/14/20  9:50 AM   Specimen: Nasal Mucosa; Nasopharyngeal  Result Value Ref Range Status   MRSA by PCR NEGATIVE NEGATIVE Final    Comment:        The GeneXpert MRSA Assay (FDA approved for NASAL specimens only), is one component of a comprehensive MRSA colonization surveillance program. It is not intended to diagnose MRSA infection nor to guide or monitor treatment for MRSA infections. Performed at [facility], [address], [place], [place] [address]          Radiology Studies: No results found.      Scheduled Meds: . albuterol  2 puff Inhalation TID  . atropine  1 mg Intravenous Once  . baricitinib  4 mg Oral Daily  . calcium-vitamin D  0.5 tablet Oral BID  . Chlorhexidine Gluconate Cloth  6 each Topical Daily  . [START ON 08/18/2020] dexamethasone  6 mg Oral Daily  . dicyclomine  10 mg Oral Once  . enoxaparin (LOVENOX) injection  60 mg Subcutaneous Q24H  . Ferrous Fumarate  1 tablet Oral QHS  . mouth rinse  15 mL Mouth Rinse BID  . multivitamins with iron  1 tablet Oral Daily  . sodium chloride flush  10-40 mL Intracatheter Q12H  . sodium chloride flush  3 mL Intravenous Q12H   Continuous Infusions: . sodium chloride 500 mL (08/15/20 1012)     LOS: 6 days    Time spent: 30 minutes    Dorcas Carrow, MD [facility] Pager [phone]

## 2020-08-17 NOTE — Plan of Care (Signed)

## 2020-08-17 NOTE — Progress Notes (Signed)
Physical Therapy Treatment Patient Details Name: [name] [name] MRN: [id number] DOB: 05-30-1986 Today's Date: 08/17/2020    History of Present Illness 34 yo female admitted with COVID, bradycardia.    PT Comments    The patient is eager to ambulate. Ambulated x 200' in room on 3 L [place], lowest SPO2 89, < Most of time > 93%. RR up to 25. HR in 80's. Patient tolerated very well. Patient has 2 flights of steps, will plan to practice steps next visit.   Follow Up Recommendations  Home health PT     Equipment Recommendations  None recommended by PT    Recommendations for Other Services       Precautions / Restrictions Precautions Precaution Comments: monitor O2    Mobility  Bed Mobility Overal bed mobility: Independent                Transfers Overall transfer level: Independent                  Ambulation/Gait Ambulation/Gait assistance: Supervision Gait Distance (Feet): 200 Feet Assistive device: None Gait Pattern/deviations: Step-through pattern   Gait velocity interpretation: <1.31 ft/sec, indicative of household ambulator General Gait Details: gait is slow initially, then increased speed to  fairly WNL   Stairs             Wheelchair Mobility    Modified Rankin (Stroke Patients Only)       Balance Overall balance assessment: No apparent balance deficits (not formally assessed)                                          Cognition Arousal/Alertness: Awake/alert Behavior During Therapy: WFL for tasks assessed/performed                                          Exercises      General Comments        Pertinent Vitals/Pain Pain Assessment: No/denies pain    Home Living                      Prior Function            PT Goals (current goals can now be found in the care plan section) Progress towards PT goals: Progressing toward goals    Frequency    Min 3X/week      PT Plan  Current plan remains appropriate    Co-evaluation              AM-PAC PT "6 Clicks" Mobility   Outcome Measure  Help needed turning from your back to your side while in a flat bed without using bedrails?: None Help needed moving from lying on your back to sitting on the side of a flat bed without using bedrails?: None Help needed moving to and from a bed to a chair (including a wheelchair)?: None Help needed standing up from a chair using your arms (e.g., wheelchair or bedside chair)?: None Help needed to walk in hospital room?: A Little Help needed climbing 3-5 steps with a railing? : A Little 6 Click Score: 22    End of Session Equipment Utilized During Treatment: Oxygen Activity Tolerance: Patient tolerated treatment well Patient left: in chair;with call bell/phone within reach;with nursing/sitter in room Nurse  Communication: Mobility status PT Visit Diagnosis: Unsteadiness on feet (R26.81)     Time: 2683-4196 PT Time Calculation (min) (ACUTE ONLY): 35 min  Charges:  $Gait Training: 8-22 mins $Self Care/Home Management: 8-22                     Blanchard Kelch PT Acute Rehabilitation Services Pager [phone] Office [phone]    Rada Hay 08/17/2020, 12:54 PM

## 2020-08-18 LAB — CREATININE, SERUM
Creatinine, Ser: 0.53 mg/dL (ref 0.44–1.00)
GFR calc Af Amer: >60 mL/min (ref >60)
GFR calc non Af Amer: >60 mL/min (ref >60)

## 2020-08-18 MED ORDER — DEXAMETHASONE 6 MG PO TABS: 6.0000 mg | ORAL_TABLET | Freq: Every day | ORAL | 0 refills | Status: AC

## 2020-08-18 NOTE — Progress Notes (Signed)
pulm SATURATION QUALIFICATIONS: (This note is used to comply with regulatory documentation for home oxygen)  Patient Saturations on Room Air at Rest = 100%  Patient Saturations on Room Air while Ambulating = 93%  Patient Saturations on Liters of oxygen while Ambulating = % N/A   Please briefly explain why patient needs home oxygen:Patient does not require supplemental oxygen. Blanchard Kelch PT Acute Rehabilitation Services Pager [phone] Office [phone]

## 2020-08-18 NOTE — TOC Transition Note (Addendum)
Transition of Care [facility]) - CM/SW Discharge Note   Patient Details  Name: [name] [name] MRN: [id number] Date of Birth: 1986/11/20  Transition of Care [facility]) CM/SW Contact:  Golda Acre, RN Phone Number: 08/18/2020, 1:54 PM   Clinical Narrative:    Patient dcd to home  With [facility] for P.T..   Final next level of care: Home/Self Care Barriers to Discharge: Barriers Resolved   Patient Goals and CMS Choice Patient states their goals for this hospitalization and ongoing recovery are:: to go home CMS Medicare.gov Compare Post Acute Care list provided to:: Patient    Discharge Placement                       Discharge Plan and Services   Discharge Planning Services: CM Consult                                 Social Determinants of Health (SDOH) Interventions     Readmission Risk Interventions No flowsheet data found.

## 2020-08-18 NOTE — Discharge Summary (Signed)
Physician Discharge Summary  [name] [name] CLE:[id number] DOB: 22-Feb-1986 DOA: 08/11/2020  PCP: Burnis Medin, PA-C  Admit date: 08/11/2020 Discharge date: 08/18/2020  Admitted From: Home Disposition: Home  Recommendations for Outpatient Follow-up:  1. Follow up with PCP in 1-2 weeks   Home Health: Home health PT Equipment/Devices: Not required  Discharge Condition: Stable CODE STATUS: Full code Diet recommendation: Regular diet  Discharge summary: 34 year old no significant medical history, symptomatic with COVID-19 since 9/6, tested positive on 9/7 came to ER on 9/10 with worsening shortness of breath.  In the emergency room, initially required 4 L oxygen.  She was 84% on room air.  Admitted due to significant symptoms.  She has not been vaccinated against COVID-19.  Admitted and treated for pneumonia due to COVID-19 virus infection with hypoxia.  Treated with COVID-19 directed therapy with the steroids, remdesivir and baricitinib.  Get adequate clinical recovery.  Today on room air and mobilized with no need for supplemental oxygen.  Most of the symptoms have improved. She will continue 2 more days of dexamethasone to complete 10 days of therapy.  Baricitinib will be stopped on discharge as she has adequate improvement.  Patient developed sinus bradycardia with heart rate as low as 37 while in the hospital that was thought to be from myocarditis with some conduction delay that ultimately improved.  Was seen and followed by cardiology.  Now resolved.  No indication for ongoing follow-up or intervention as per cardiology.  Significantly debilitated from ongoing viral infection.  Will benefit with therapies at home.  Will prescribe home health physical therapy. Discussed in detail about proven benefits of vaccination and she can take vaccine when she is improved from current symptoms.  Discharge Diagnoses:  Principal Problem:   Pneumonia due to COVID-19 virus Active  Problems:   Acute respiratory failure due to COVID-19 (HCC)   Obesity (BMI 30-39.9)   Sinus bradycardia   Abnormal ECG    Discharge Instructions  Discharge Instructions    Call MD for:  difficulty breathing, headache or visual disturbances   Complete by: As directed    Call MD for:  extreme fatigue   Complete by: As directed    Call MD for:  temperature >100.4   Complete by: As directed    Diet general   Complete by: As directed    Discharge instructions   Complete by: As directed    Can use over-the-counter cough medications and Tylenol as needed. Total isolation duration 3 weeks from initial positive Covid test. Highly encourage you to take COVID-19 vaccination once you are improved from current symptoms.   Increase activity slowly   Complete by: As directed      Allergies as of 08/18/2020      Reactions   Sulfa Antibiotics Hives      Medication List    TAKE these medications   albuterol 108 (90 Base) MCG/ACT inhaler Commonly known as: VENTOLIN HFA Inhale 2 puffs into the lungs every 4 (four) hours as needed for wheezing or shortness of breath.   calcium-vitamin D 250-100 MG-UNIT tablet Take 1 tablet by mouth 2 (two) times daily.   dexamethasone 6 MG tablet Commonly known as: DECADRON Take 1 tablet (6 mg total) by mouth daily for 2 days. Start taking on: August 19, 2020   ferrous fumarate 325 (106 Fe) MG Tabs tablet Commonly known as: HEMOCYTE - 106 mg FE Take 1 tablet (106 mg of iron total) by mouth at bedtime.   multivitamins with iron Tabs  tablet Take 1 tablet by mouth daily.       Follow-up Information    [place], [place] E, PA-C Follow up in 1 week(s).   Specialty: Family Medicine Contact information: [phone]ure[address] [place] [address] [phone]              Allergies  Allergen Reactions  . Sulfa Antibiotics Hives    Consultations:  Cardiology   Procedures/Studies: DG CHEST PORT 1 VIEW  Result Date:  08/14/2020 CLINICAL DATA:  Cough, shortness of breath EXAM: PORTABLE CHEST 1 VIEW COMPARISON:  08/11/2020 FINDINGS: No significant interval change in low volume AP portable chest radiograph with bilateral heterogeneous and interstitial airspace opacity and possible small layering pleural effusions. No new or focal airspace opacity. Mild cardiomegaly. IMPRESSION: No significant interval change in low volume AP portable chest radiograph with bilateral heterogeneous and interstitial airspace opacity and possible small layering pleural effusions. No new or focal airspace opacity. Electronically Signed   By: Lauralyn Primes M.D.   On: 08/14/2020 08:14   DG Chest Port 1 View  Result Date: 08/11/2020 CLINICAL DATA:  Shortness of breath.  COVID-19 positive EXAM: PORTABLE CHEST 1 VIEW COMPARISON:  January 24, 2012 FINDINGS: There is airspace opacity in both mid and lower lung regions. Heart is upper normal in size with pulmonary vascularity normal. No adenopathy. No bone lesions. IMPRESSION: Airspace opacity in both mid and lower lung regions consistent with multifocal pneumonia. Atypical organism pneumonia most likely given this appearance and clinical history. Heart upper normal in size.  No adenopathy demonstrable. Electronically Signed   By: Bretta Bang III M.D.   On: 08/11/2020 13:06   ECHOCARDIOGRAM COMPLETE  Result Date: 08/14/2020    ECHOCARDIOGRAM REPORT   Patient Name:   NAAVYA [name] Date of Exam: 08/14/2020 Medical Rec #:  [id number]      Height:       71.0 in Accession #:    [id number]     Weight:       257.0 lb Date of Birth:  [birth year]      BSA:          2.346 m Patient Age:    34 years       BP:           97/82 mmHg Patient Gender: F              HR:           41 bpm. Exam Location:  Inpatient Procedure: 2D Echo, Cardiac Doppler and Color Doppler Indications:   Dysnpea  History:       Patient has no prior history of Echocardiogram examinations.                Anemia.  Sonographer:   Elmarie Shiley Dance  Referring      [phone] ERIC J [place] Phys: IMPRESSIONS  1. Marked sinus bradycardia. Left ventricular ejection fraction, by estimation, is 55 to 60%. The left ventricle has normal function. The left ventricle has no regional wall motion abnormalities. Left ventricular diastolic parameters were normal.  2. Right ventricular systolic function is normal. The right ventricular size is normal.  3. The pericardial effusion is posterior to the left ventricle.  4. The mitral valve is grossly normal. Trivial mitral valve regurgitation.  5. The aortic valve is tricuspid. Aortic valve regurgitation is not visualized. FINDINGS  Left Ventricle: Marked sinus bradycardia. Left ventricular ejection fraction, by estimation, is 55 to 60%. The left ventricle has normal function.  The left ventricle has no regional wall motion abnormalities. The left ventricular internal cavity size was normal in size. There is no left ventricular hypertrophy. Left ventricular diastolic parameters were normal. Indeterminate filling pressures. Right Ventricle: The right ventricular size is normal. No increase in right ventricular wall thickness. Right ventricular systolic function is normal. Left Atrium: Left atrial size was normal in size. Right Atrium: Right atrial size was normal in size. Pericardium: Trivial pericardial effusion is present. The pericardial effusion is posterior to the left ventricle. Mitral Valve: The mitral valve is grossly normal. Trivial mitral valve regurgitation. Tricuspid Valve: The tricuspid valve is grossly normal. Tricuspid valve regurgitation is trivial. Aortic Valve: The aortic valve is tricuspid. Aortic valve regurgitation is not visualized. Pulmonic Valve: The pulmonic valve was grossly normal. Pulmonic valve regurgitation is trivial. Aorta: The aortic root and ascending aorta are structurally normal, with no evidence of dilitation. Venous: The inferior vena cava was not well visualized. IAS/Shunts: No atrial level  shunt detected by color flow Doppler.  LEFT VENTRICLE PLAX 2D LVIDd:         4.70 cm  Diastology LVIDs:         3.20 cm  LV e' medial:    8.92 cm/s LV PW:         0.90 cm  LV E/e' medial:  10.0 LV IVS:        0.70 cm  LV e' lateral:   9.03 cm/s LVOT diam:     2.00 cm  LV E/e' lateral: 9.9 LV SV:         62 LV SV Index:   27 LVOT Area:     3.14 cm  RIGHT VENTRICLE             IVC RV Basal diam:  2.90 cm     IVC diam: 1.80 cm RV S prime:     16.30 cm/s TAPSE (M-mode): 3.0 cm LEFT ATRIUM             Index       RIGHT ATRIUM           Index LA diam:        3.40 cm 1.45 cm/m  RA Area:     16.70 cm LA Vol (A2C):   58.7 ml 25.02 ml/m RA Volume:   42.60 ml  18.16 ml/m LA Vol (A4C):   42.5 ml 18.12 ml/m LA Biplane Vol: 50.2 ml 21.40 ml/m  AORTIC VALVE LVOT Vmax:   75.20 cm/s LVOT Vmean:  51.100 cm/s LVOT VTI:    0.198 m  AORTA Ao Root diam: 3.10 cm Ao Asc diam:  2.50 cm MITRAL VALVE MV Area (PHT): 2.56 cm    SHUNTS MV Decel Time: 296 msec    Systemic VTI:  0.20 m MV E velocity: 89.30 cm/s  Systemic Diam: 2.00 cm MV A velocity: 59.50 cm/s MV E/A ratio:  1.50 Zoila Shutter MD Electronically signed by Zoila Shutter MD Signature Date/Time: 08/14/2020/2:13:03 PM    Final    (Echo, Carotid, EGD, Colonoscopy, ERCP)    Subjective: Patient seen and examined.  No overnight events.  Mobilize around.  Oxygen more than 90% on mobility.  Has some hesitancy to take flight of stairs, however she was able to do stair training with physical therapy.   Discharge Exam: Vitals:   08/18/20 0500 08/18/20 0600  BP:    Pulse: (!) 38 (!) 39  Resp: (!) 21 17  Temp:    SpO2: 100% 98%  Vitals:   08/18/20 0300 08/18/20 0400 08/18/20 0500 08/18/20 0600  BP:      Pulse: (!) 41 (!) 44 (!) 38 (!) 39  Resp: 17 (!) 21 (!) 21 17  Temp:  97.6 F (36.4 C)    TempSrc:  Axillary    SpO2: 99% 97% 100% 98%  Weight:      Height:        General: Pt is alert, awake, not in acute distress Cardiovascular: RRR, S1/S2 +, no rubs, no  gallops Respiratory: CTA bilaterally, no wheezing, no rhonchi Abdominal: Soft, NT, ND, bowel sounds + Extremities: no edema, no cyanosis    The results of significant diagnostics from this hospitalization (including imaging, microbiology, ancillary and laboratory) are listed below for reference.     Microbiology: Recent Results (from the past 240 hour(s))  Blood Culture (routine x 2)     Status: None   Collection Time: 08/11/20 12:49 PM   Specimen: BLOOD  Result Value Ref Range Status   Specimen Description   Final    BLOOD SITE NOT SPECIFIED Performed at [facility], [address] [place], [place] [address]    Special Requests   Final    BOTTLES DRAWN AEROBIC AND ANAEROBIC Blood Culture adequate volume Performed at [facility], [address] [place], [place] [address]    Culture   Final    NO GROWTH 5 DAYS Performed at [facility], [address] [place], [place] [address]    Report Status 08/16/2020 FINAL  Final  Blood Culture (routine x 2)     Status: None   Collection Time: 08/11/20  9:36 PM   Specimen: BLOOD LEFT HAND  Result Value Ref Range Status   Specimen Description   Final    BLOOD LEFT HAND Performed at [facility], [address] [place], [place] [address]    Special Requests   Final    BOTTLES DRAWN AEROBIC ONLY Blood Culture adequate volume Performed at [facility], [address] [place], [place] [address]    Culture   Final    NO GROWTH 5 DAYS Performed at [facility], [address] [place], [place] [address]    Report Status 08/17/2020 FINAL  Final  MRSA PCR Screening     Status: None   Collection Time: 08/14/20  9:50 AM   Specimen: Nasal Mucosa; Nasopharyngeal  Result Value Ref Range Status   MRSA by PCR NEGATIVE NEGATIVE Final    Comment:        The GeneXpert MRSA Assay (FDA approved for NASAL specimens only), is one component of  a comprehensive MRSA colonization surveillance program. It is not intended to diagnose MRSA infection nor to guide or monitor treatment for MRSA infections. Performed at [facility], [address] [place], [place] [address]      Labs: BNP (last 3 results) No results for input(s): BNP in the last 8760 hours. Basic Metabolic Panel: Recent Labs  Lab 08/12/20 0450 08/12/20 0450 08/13/20 1142 08/14/20 0515 08/15/20 1130 08/16/20 0506 08/18/20 0500  NA 140  --  139 138 140 139  --   K 3.8  --  3.7 3.9 3.6 4.2  --   CL 101  --  100 100 99 100  --   CO2 25  --  26 27 27 27   --   GLUCOSE 136*  --  124* 120* 103* 101*  --  BUN 9  --  13 15 14 11   --   CREATININE 0.55   < > 0.65 0.58 0.64 0.53 0.53  CALCIUM 9.1  --  9.4 9.2 9.1 9.1  --   MG 2.2  --  2.2 1.9 1.9 2.0  --   PHOS 3.1  --  2.9 5.4* 3.4 4.5  --    < > = values in this interval not displayed.   Liver Function Tests: Recent Labs  Lab 08/12/20 0450 08/13/20 1142 08/14/20 0515 08/15/20 1130 08/16/20 0506  AST 46* 48* 57* 41 29  ALT 28 37 56* 61* 55*  ALKPHOS 43 42 37* 38 37*  BILITOT 0.7 0.4 0.6 0.5 0.4  PROT 7.6 7.8 6.9 6.5 6.3*  ALBUMIN 3.2* 3.2* 3.0* 2.7* 2.8*   No results for input(s): LIPASE, AMYLASE in the last 168 hours. No results for input(s): AMMONIA in the last 168 hours. CBC: Recent Labs  Lab 08/12/20 0450 08/13/20 0502 08/14/20 0515 08/15/20 1130 08/16/20 0506  WBC 5.9 8.6 10.8* 11.9* 13.2*  NEUTROABS 4.7 6.5 8.0* 8.4* 10.0*  HGB 11.3* 11.2* 11.2* 10.3* 10.4*  HCT 37.0 36.9 36.1 32.7* 34.1*  MCV 72.5* 72.9* 72.6* 73.6* 73.7*  PLT 295 362 467* 483* 462*   Cardiac Enzymes: No results for input(s): CKTOTAL, CKMB, CKMBINDEX, TROPONINI in the last 168 hours. BNP: Invalid input(s): POCBNP CBG: Recent Labs  Lab 08/14/20 0659  GLUCAP 123*   D-Dimer No results for input(s): DDIMER in the last 72 hours. Hgb A1c No results for input(s): HGBA1C in the last 72  hours. Lipid Profile No results for input(s): CHOL, HDL, LDLCALC, TRIG, CHOLHDL, LDLDIRECT in the last 72 hours. Thyroid function studies No results for input(s): TSH, T4TOTAL, T3FREE, THYROIDAB in the last 72 hours.  Invalid input(s): FREET3 Anemia work up Recent Labs    08/16/20 0506  FERRITIN 70   Urinalysis    Component Value Date/Time   COLORURINE YELLOW 03/01/2010 1509   APPEARANCEUR CLEAR 03/01/2010 1509   LABSPEC 1.017 03/01/2010 1509   PHURINE 5.5 03/01/2010 1509   GLUCOSEU NEGATIVE 03/01/2010 1509   HGBUR LARGE (A) 03/01/2010 1509   BILIRUBINUR NEGATIVE 03/01/2010 1509   KETONESUR 15 (A) 03/01/2010 1509   PROTEINUR NEGATIVE 03/01/2010 1509   UROBILINOGEN 0.2 03/01/2010 1509   NITRITE NEGATIVE 03/01/2010 1509   LEUKOCYTESUR NEGATIVE 03/01/2010 1509   Sepsis Labs Invalid input(s): PROCALCITONIN,  WBC,  LACTICIDVEN Microbiology Recent Results (from the past 240 hour(s))  Blood Culture (routine x 2)     Status: None   Collection Time: 08/11/20 12:49 PM   Specimen: BLOOD  Result Value Ref Range Status   Specimen Description   Final    BLOOD SITE NOT SPECIFIED Performed at [facility], [address] [place], [place]    Special Requests   Final    BOTTLES DRAWN AEROBIC AND ANAEROBIC Blood Culture adequate volume Performed at [facility], [address] [place], [place]    Culture   Final    NO GROWTH 5 DAYS Performed at [facility], [address], [place], [place]    Report Status 08/16/2020 FINAL  Final  Blood Culture (routine x 2)     Status: None   Collection Time: 08/11/20  9:36 PM   Specimen: BLOOD LEFT HAND  Result Value Ref Range Status   Specimen Description   Final    BLOOD LEFT HAND Performed at [facility],  [address], [place], [place] [address]    Special Requests   Final    BOTTLES DRAWN AEROBIC ONLY Blood Culture adequate  volume Performed at [facility], [address], [place], [place] [address]    Culture   Final    NO GROWTH 5 DAYS Performed at [facility], [address], [place], [place] [address]    Report Status 08/17/2020 FINAL  Final  MRSA PCR Screening     Status: None   Collection Time: 08/14/20  9:50 AM   Specimen: Nasal Mucosa; Nasopharyngeal  Result Value Ref Range Status   MRSA by PCR NEGATIVE NEGATIVE Final    Comment:        The GeneXpert MRSA Assay (FDA approved for NASAL specimens only), is one component of a comprehensive MRSA colonization surveillance program. It is not intended to diagnose MRSA infection nor to guide or monitor treatment for MRSA infections. Performed at [facility], [address] [place], [place] [address]      Time coordinating discharge: 35 minutes  SIGNED:   Dorcas Carrow, MD  [facility] 08/18/2020, 11:54 AM

## 2020-08-18 NOTE — Progress Notes (Signed)
Physical Therapy Treatment Patient Details Name: [name] [name] MRN: [id number] DOB: 1986/11/11 Today's Date: 08/18/2020    History of Present Illness 34 yo female admitted with COVID, bradycardia.    PT Comments    Provided written instructions for U/LE HEP, theraband for strengthening. Patient teaches back on  Technique. Plans DC soon.  Follow Up Recommendations  Home health PT     Equipment Recommendations  None recommended by PT    Recommendations for Other Services       Precautions / Restrictions Precautions Precaution Comments: monitor O2    Mobility         Wheelchair Mobility    Modified Rankin (Stroke Patients Only)       Balance                                            Cognition                                              Exercises Other Exercises Other Exercises: provided theraband  and UE HEP, gradually work up from 1 set of 10 to  3 sets of 10 once daily, 7 x's week Other Exercises: LE exercies handout provided which patient performed previous session and acknowledges techniques.7   General Comments        Pertinent Vitals/Pain Pain Assessment: No/denies pain    Home Living                      Prior Function            PT Goals (current goals can now be found in the care plan section) Progress towards PT goals: Progressing toward goals    Frequency    Min 3X/week      PT Plan Current plan remains appropriate    Co-evaluation              AM-PAC PT "6 Clicks" Mobility   Outcome Measure  Help needed turning from your back to your side while in a flat bed without using bedrails?: None Help needed moving from lying on your back to sitting on the side of a flat bed without using bedrails?: None Help needed moving to and from a bed to a chair (including a wheelchair)?: None Help needed standing up from a chair using your arms (e.g., wheelchair or bedside chair)?:  None Help needed to walk in hospital room?: None Help needed climbing 3-5 steps with a railing? : A Little 6 Click Score: 23    End of Session   Activity Tolerance: Patient tolerated treatment well Patient left: in chair;with call bell/phone within reach;with nursing/sitter in room Nurse Communication: Mobility status PT Visit Diagnosis: Difficulty in walking, not elsewhere classified (R26.2)     Time: 1110-1120 PT Time Calculation (min) (ACUTE ONLY): 10 min  Charges: Therapeutic exer 1 unit                    Blanchard Kelch PT Acute Rehabilitation Services Pager [phone] Office [phone]   Rada Hay 08/18/2020, 2:00 PM

## 2020-08-18 NOTE — Progress Notes (Signed)
Physical Therapy Treatment Patient Details Name: [name] [name] MRN: [id number] DOB: [birth year] Today's Date: 08/18/2020    History of Present Illness 34 yo female admitted with COVID, bradycardia.    PT Comments    The patient's SPO2 remained >92% throughout ambulation and up/down 30steps. Patient plans Dc home. Recommend HHPT..   Follow Up Recommendations  Home health PT     Equipment Recommendations  None recommended by PT    Recommendations for Other Services       Precautions / Restrictions Precautions Precaution Comments: monitor O2    Mobility  Bed Mobility Overal bed mobility: Independent                Transfers Overall transfer level: Independent     Sit to Stand: Independent            Ambulation/Gait Ambulation/Gait assistance: Independent Gait Distance (Feet): 150 Feet Assistive device: None Gait Pattern/deviations: WFL(Within Functional Limits)         Stairs Stairs: Yes Stairs assistance: Supervision Stair Management: One rail Right Number of Stairs: 30 General stair comments: stepped up and down 30 steps on RA. frequent stand resting breaks   Wheelchair Mobility    Modified Rankin (Stroke Patients Only)       Balance                                            Cognition                                              Exercises      General Comments        Pertinent Vitals/Pain Pain Assessment: No/denies pain    Home Living                      Prior Function            PT Goals (current goals can now be found in the care plan section) Progress towards PT goals: Progressing toward goals    Frequency    Min 3X/week      PT Plan Current plan remains appropriate    Co-evaluation              AM-PAC PT "6 Clicks" Mobility   Outcome Measure  Help needed turning from your back to your side while in a flat bed without using bedrails?: None Help  needed moving from lying on your back to sitting on the side of a flat bed without using bedrails?: None Help needed moving to and from a bed to a chair (including a wheelchair)?: None Help needed standing up from a chair using your arms (e.g., wheelchair or bedside chair)?: None Help needed to walk in hospital room?: None Help needed climbing 3-5 steps with a railing? : A Little 6 Click Score: 23    End of Session   Activity Tolerance: Patient tolerated treatment well Patient left: in chair;with call bell/phone within reach;with nursing/sitter in room Nurse Communication: Mobility status PT Visit Diagnosis: Unsteadiness on feet (R26.81)     Time: 1027-1100 PT Time Calculation (min) (ACUTE ONLY): 33 min  Charges:  $Gait Training: 23-37 mins  Blanchard Kelch PT Acute Rehabilitation Services Pager [phone] Office [phone]    Rada Hay 08/18/2020, 1:54 PM

## 2020-08-18 NOTE — Discharge Instructions (Signed)
10 Things You Can Do to Manage Your COVID-19 Symptoms at Home If you have possible or confirmed COVID-19: 1. Stay home from work and school. And stay away from other public places. If you must go out, avoid using any kind of public transportation, ridesharing, or taxis. 2. Monitor your symptoms carefully. If your symptoms get worse, call your healthcare provider immediately. 3. Get rest and stay hydrated. 4. If you have a medical appointment, call the healthcare provider ahead of time and tell them that you have or may have COVID-19. 5. For medical emergencies, call 911 and notify the dispatch personnel that you have or may have COVID-19. 6. Cover your cough and sneezes with a tissue or use the inside of your elbow. 7. Wash your hands often with soap and water for at least 20 seconds or clean your hands with an alcohol-based hand sanitizer that contains at least 60% alcohol. 8. As much as possible, stay in a specific room and away from other people in your home. Also, you should use a separate bathroom, if available. If you need to be around other people in or outside of the home, wear a mask. 9. Avoid sharing personal items with other people in your household, like dishes, towels, and bedding. 10. Clean all surfaces that are touched often, like counters, tabletops, and doorknobs. Use household cleaning sprays or wipes according to the label instructions. cdc.gov/coronavirus 06/02/2019 This information is not intended to replace advice given to you by your health care provider. Make sure you discuss any questions you have with your health care provider. Document Revised: 11/04/2019 Document Reviewed: 11/04/2019 Elsevier Patient Education  2020 [employer]  

## 2021-07-25 ENCOUNTER — Other Ambulatory Visit: Payer: Self-pay | Admitting: Obstetrics and Gynecology

## 2021-08-26 ENCOUNTER — Emergency Department (HOSPITAL_COMMUNITY)
Admission: EM | Admit: 2021-08-26 | Discharge: 2021-08-27 | Disposition: A | Discharge status: Home / Self Care | Payer: BC Managed Care – PPO | Attending: Emergency Medicine | Admitting: Emergency Medicine

## 2021-08-26 ENCOUNTER — Encounter (HOSPITAL_COMMUNITY): Payer: Self-pay | Admitting: *Deleted

## 2021-08-26 ENCOUNTER — Other Ambulatory Visit: Payer: Self-pay

## 2021-08-26 DIAGNOSIS — R42 Dizziness and giddiness: Secondary | ICD-10-CM | POA: Insufficient documentation

## 2021-08-26 DIAGNOSIS — N9489 Other specified conditions associated with female genital organs and menstrual cycle: Secondary | ICD-10-CM | POA: Diagnosis not present

## 2021-08-26 DIAGNOSIS — R109 Unspecified abdominal pain: Secondary | ICD-10-CM | POA: Diagnosis not present

## 2021-08-26 DIAGNOSIS — M545 Low back pain, unspecified: Secondary | ICD-10-CM | POA: Insufficient documentation

## 2021-08-26 DIAGNOSIS — N939 Abnormal uterine and vaginal bleeding, unspecified: Secondary | ICD-10-CM | POA: Insufficient documentation

## 2021-08-26 DIAGNOSIS — Z8616 Personal history of COVID-19: Secondary | ICD-10-CM | POA: Insufficient documentation

## 2021-08-26 LAB — I-STAT BETA HCG BLOOD, ED (MC, WL, AP ONLY): I-stat hCG, quantitative: <5 m[IU]/mL (ref <5)

## 2021-08-26 NOTE — ED Provider Notes (Signed)
Emergency Medicine Provider Triage Evaluation Note  [name] [name] , a 35 y.o. female  was evaluated in triage.  Pt complains of abnormal vaginal bleeding.  History of same and is being seen by [facility] for management.  States that she was given 21 days of progesterone which she finished yesterday.  Began new birth control this morning, managed fine originally until this evening when she had severe bleeding and filled her toilet with large clots.  Had associated cramping and lightheadedness.  Hypotensive with EMS who gave her 500 mL of normal saline.  Patient is now feeling better after fluid administration.  Does have longstanding history of anemia.  Review of Systems  Positive: Fatigue, lightheadedness, abdominal cramps Negative:   Physical Exam  BP 134/88 (BP Location: Left Arm)   Pulse (!) 55   Temp 98.6 F (37 C) (Oral)   Resp (!) 21   Ht 5\' 11"  (1.803 m)   Wt 120.2 kg   LMP 08/26/2021   SpO2 99%   BMI 36.96 kg/m  Gen:   Awake, no distress   Resp:  Normal effort  MSK:   Moves extremities without difficulty    Medical Decision Making  Medically screening exam initiated at 9:53 PM.  Appropriate orders placed.  [name] [name] was informed that the remainder of the evaluation will be completed by another provider, this initial triage assessment does not replace that evaluation, and the importance of remaining in the ED until their evaluation is complete.     08/28/2021 08/26/21 2156    2157, MD 08/27/21 [phone]

## 2021-08-26 NOTE — ED Triage Notes (Signed)
The pt arrived by [facility] from home  lmp 21 days ago her period lasted  30 days.  She was placed on anew drug and she took her first pill at 800am today  severe bleeding approx  one hour ago her toilet filled up with large clots.  Witnessed by [facility] iv per ems of [facility] infused  the pt had back and abd pain   that has since become less bp by ems was  78  after iv fluids bp now 118/78  lmp for 30 days

## 2021-08-26 NOTE — ED Provider Notes (Signed)
[facility] EMERGENCY DEPARTMENT Provider Note   CSN: [id number] Arrival date & time: 08/26/21  2111     History Chief Complaint  Patient presents with   Vaginal Bleeding    [name] [name] is a 35 y.o. female.  The history is provided by the patient.  Vaginal Bleeding Quality:  Clots Severity:  Severe Onset quality:  Sudden Timing:  Constant Progression:  Improving Chronicity:  New Possible pregnancy: no   Relieved by:  None tried Worsened by:  Nothing Associated symptoms: abdominal pain and back pain   Associated symptoms: no dysuria and no fever   Patient presents with abrupt onset of vaginal bleeding, abdominal and back pain and cramping.  Patient reports she just completed a 3-week course of progesterone.  She then began a new birth control [place]) as instructed earlier in the day.  She reports earlier in the evening she began having severe abdominal pain/cramping and back pain.  She then began having severe vaginal bleeding and passing large blood clots.  She felt lightheadedness.  She reports calling EMS  she is now feeling improved She is not on anticoagulation    Past Medical History:  Diagnosis Date   Anemia     Patient Active Problem List   Diagnosis Date Noted   Sinus bradycardia    Abnormal ECG    Obesity (BMI 30-39.9) 08/12/2020   Pneumonia due to COVID-19 virus 08/11/2020   Acute respiratory failure due to COVID-19 [facility]) 08/11/2020   Appendicitis, acute 02/25/2012   Vaginal bleeding 02/14/2012   Renal insufficiency 02/14/2012   Pleural effusion 02/14/2012   Anemia 02/10/2012   Ileus following gastrointestinal surgery (HCC) 02/03/2012    Past Surgical History:  Procedure Laterality Date   APPENDECTOMY     APPLICATION OF WOUND VAC  01/29/2012   Procedure: APPLICATION OF WOUND VAC;  Surgeon: Robyne Askew, MD;  Location: [facility];  Service: General;;   LAPAROSCOPY  01/29/2012   Procedure: LAPAROSCOPY DIAGNOSTIC;  Surgeon: Robyne Askew, MD;  Location: [facility];  Service: General;  Laterality: N/A;   LAPAROTOMY  01/29/2012   Procedure: EXPLORATORY LAPAROTOMY;  Surgeon: Robyne Askew, MD;  Location: [facility];  Service: General;  Laterality: N/A;  OPEN RIGHT COLECTOMY ,TERMINAL ILEUM AND APPENDECTOMY     OB History   No obstetric history on file.     No family history on file.  Social History   Tobacco Use   Smoking status: Never   Smokeless tobacco: Never  Substance Use Topics   Alcohol use: No   Drug use: No    Home Medications Prior to Admission medications   Medication Sig Start Date End Date Taking? Authorizing Provider  albuterol (VENTOLIN HFA) 108 (90 Base) MCG/ACT inhaler Inhale 2 puffs into the lungs every 4 (four) hours as needed for wheezing or shortness of breath.  08/09/20   [provider]  calcium-vitamin D 250-100 MG-UNIT per tablet Take 1 tablet by mouth 2 (two) times daily.    [provider]  ferrous fumarate (HEMOCYTE - 106 MG FE) 325 (106 FE) MG TABS Take 1 tablet (106 mg of iron total) by mouth at bedtime. 02/19/12   Sherrie George, PA-C  Multiple Vitamins-Iron (MULTIVITAMINS WITH IRON) TABS Take 1 tablet by mouth daily. 02/19/12   Sherrie George, PA-C    Allergies    Sulfa antibiotics  Review of Systems   Review of Systems  Constitutional:  Negative for fever.  Respiratory:  Negative  for cough.   Gastrointestinal:  Positive for abdominal pain.  Genitourinary:  Positive for vaginal bleeding. Negative for dysuria.  Musculoskeletal:  Positive for back pain.  Neurological:  Positive for light-headedness. Negative for headaches.  All other systems reviewed and are negative.  Physical Exam Updated Vital Signs BP 134/88 (BP Location: Left Arm)   Pulse (!) 55   Temp 98.6 F (37 C) (Oral)   Resp (!) 21   Ht 1.803 m (5\' 11" )   Wt 120.2 kg   LMP 08/26/2021   SpO2 99%   BMI 36.96 kg/m   Physical Exam CONSTITUTIONAL: Well developed/well nourished HEAD:  Normocephalic/atraumatic EYES: EOMI/PERRL, conjunctiva pink ENMT: Mucous membranes moist NECK: supple no meningeal signs SPINE/BACK:entire spine nontender CV: S1/S2 noted, no murmurs/rubs/gallops noted LUNGS: Lungs are clear to auscultation bilaterally, no apparent distress ABDOMEN: soft, nontender, no rebound or guarding, bowel sounds noted throughout abdomen GU:no cva tenderness, small mount of vaginal bleeding noted.  No active bleeding or clots noted.  No lacerations.  Female chaperone present for exam NEURO: Pt is awake/alert/appropriate, moves all extremitiesx4.  No facial droop.   EXTREMITIES: pulses normal/equal, full ROM SKIN: warm, color normal PSYCH: no abnormalities of mood noted, alert and oriented to situation  ED Results / Procedures / Treatments   Labs (all labs ordered are listed, but only abnormal results are displayed) Labs Reviewed  CBC WITH DIFFERENTIAL/PLATELET - Abnormal; Notable for the following components:      Result Value   WBC 14.7 (*)    Hemoglobin 11.7 (*)    MCV 73.9 (*)    MCH 23.1 (*)    Neutro Abs 11.5 (*)    All other components within normal limits  CBC WITH DIFFERENTIAL/PLATELET  CBC WITH DIFFERENTIAL/PLATELET  COMPREHENSIVE METABOLIC PANEL  COMPREHENSIVE METABOLIC PANEL  I-STAT BETA HCG BLOOD, ED ([facility], [facility], [facility] ONLY)  TYPE AND SCREEN  TYPE AND SCREEN    EKG None  Radiology No results found.  Procedures Procedures   Medications Ordered in ED Medications - No data to display  ED Course  I have reviewed the triage vital signs and the nursing notes.  Pertinent labs results that were available during my care of the patient were reviewed by me and considered in my medical decision making (see chart for details).    MDM Rules/Calculators/A&P                           Patient reports recently changing from progesterone to oral contraceptives.  She then reports a heavy episode of vaginal bleeding.  She was reportedly hypotensive for  EMS, but that is improved.  Her bleeding is also improving.  Hemoglobin is appropriate at this time.  Patient is overall hemodynamically appropriate for discharge home.  She can follow-up with her gynecologist tomorrow Final Clinical Impression(s) / ED Diagnoses Final diagnoses:  Abnormal vaginal bleeding    Rx / DC Orders ED Discharge Orders     None        08/28/2021, MD 08/27/21 [phone]

## 2021-08-27 LAB — COMPREHENSIVE METABOLIC PANEL
ALT: 20 U/L (ref 0–44)
AST: 22 U/L (ref 15–41)
Albumin: 3.2 g/dL — ABNORMAL LOW (ref 3.5–5.0)
Alkaline Phosphatase: 46 U/L (ref 38–126)
Anion gap: 7 (ref 5–15)
BUN: 10 mg/dL (ref 6–20)
CO2: 22 mmol/L (ref 22–32)
Calcium: 8.9 mg/dL (ref 8.9–10.3)
Chloride: 109 mmol/L (ref 98–111)
Creatinine, Ser: 0.83 mg/dL (ref 0.44–1.00)
GFR, Estimated: >60 mL/min (ref >60)
Glucose, Bld: 112 mg/dL — ABNORMAL HIGH (ref 70–99)
Potassium: 4.2 mmol/L (ref 3.5–5.1)
Sodium: 138 mmol/L (ref 135–145)
Total Bilirubin: 0.3 mg/dL (ref 0.3–1.2)
Total Protein: 6.8 g/dL (ref 6.5–8.1)

## 2021-08-27 LAB — CBC WITH DIFFERENTIAL/PLATELET
Abs Immature Granulocytes: 0.05 10*3/uL (ref 0.00–0.07)
Basophils Absolute: 0.1 10*3/uL (ref 0.0–0.1)
Basophils Relative: 1 %
Eosinophils Absolute: 0 10*3/uL (ref 0.0–0.5)
Eosinophils Relative: 0 %
HCT: 37.4 % (ref 36.0–46.0)
Hemoglobin: 11.7 g/dL — ABNORMAL LOW (ref 12.0–15.0)
Immature Granulocytes: 0 %
Lymphocytes Relative: 14 %
Lymphs Abs: 2 10*3/uL (ref 0.7–4.0)
MCH: 23.1 pg — ABNORMAL LOW (ref 26.0–34.0)
MCHC: 31.3 g/dL (ref 30.0–36.0)
MCV: 73.9 fL — ABNORMAL LOW (ref 80.0–100.0)
Monocytes Absolute: 1 10*3/uL (ref 0.1–1.0)
Monocytes Relative: 7 %
Neutro Abs: 11.5 10*3/uL — ABNORMAL HIGH (ref 1.7–7.7)
Neutrophils Relative %: 78 %
Platelets: 380 10*3/uL (ref 150–400)
RBC: 5.06 MIL/uL (ref 3.87–5.11)
RDW: 15.4 % (ref 11.5–15.5)
WBC: 14.7 10*3/uL — ABNORMAL HIGH (ref 4.0–10.5)
nRBC: 0 % (ref 0.0–0.2)

## 2021-08-27 LAB — TYPE AND SCREEN
ABO/RH(D): A POS
ABO/RH(D): A POS
Antibody Screen: NEGATIVE
Antibody Screen: NEGATIVE

## 2022-12-05 ENCOUNTER — Encounter (HOSPITAL_BASED_OUTPATIENT_CLINIC_OR_DEPARTMENT_OTHER): Payer: Self-pay | Admitting: Emergency Medicine

## 2022-12-05 ENCOUNTER — Other Ambulatory Visit: Payer: Self-pay

## 2022-12-05 ENCOUNTER — Emergency Department (HOSPITAL_BASED_OUTPATIENT_CLINIC_OR_DEPARTMENT_OTHER)
Admission: EM | Admit: 2022-12-05 | Discharge: 2022-12-06 | Disposition: A | Discharge status: Home / Self Care | Payer: BC Managed Care – PPO | Attending: Emergency Medicine | Admitting: Emergency Medicine

## 2022-12-05 DIAGNOSIS — L910 Hypertrophic scar: Secondary | ICD-10-CM | POA: Diagnosis present

## 2022-12-05 DIAGNOSIS — L02211 Cutaneous abscess of abdominal wall: Secondary | ICD-10-CM | POA: Diagnosis not present

## 2022-12-05 DIAGNOSIS — R1033 Periumbilical pain: Secondary | ICD-10-CM | POA: Insufficient documentation

## 2022-12-05 NOTE — ED Triage Notes (Signed)
  Patient comes in with abscess involving umbilicus.  Patient states she noticed it getting red, swollen, and warm a two days ago and it ruptured today around 1600 while she was at work.  Patient states it drained bloody pus.  No fevers at home.  Pain 6/10, pressure around abscess.  No OTC meds at home.

## 2022-12-05 NOTE — ED Provider Notes (Signed)
[facility] Emergency Department Provider Note MRN:  [id number]  Arrival date & time: 12/06/22     Chief Complaint   Abscess   History of Present Illness   [name] [name] is a 37 y.o. year-old female with a history of perforated appendicitis presenting to the ED with chief complaint of abscess.  Patient explains that back in 2013 she had a ruptured appendix that required an exploratory laparotomy.  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  Over the past 2 days the keloid area has become painful, tender, red, today it began draining some bloody purulent material.  Denies fever, no other complaints.  Review of Systems  A thorough review of systems was obtained and all systems are negative except as noted in the HPI and PMH.   Patient's Health History    Past Medical History:  Diagnosis Date   Anemia     Past Surgical History:  Procedure Laterality Date   APPENDECTOMY     APPLICATION OF WOUND VAC  01/29/2012   Procedure: APPLICATION OF WOUND VAC;  Surgeon: Merrie Roof, MD;  Location: [facility];  Service: General;;   LAPAROSCOPY  01/29/2012   Procedure: LAPAROSCOPY DIAGNOSTIC;  Surgeon: Merrie Roof, MD;  Location: [facility];  Service: General;  Laterality: N/A;   LAPAROTOMY  01/29/2012   Procedure: EXPLORATORY LAPAROTOMY;  Surgeon: Merrie Roof, MD;  Location: [facility];  Service: General;  Laterality: N/A;  OPEN RIGHT COLECTOMY ,TERMINAL ILEUM AND APPENDECTOMY    History reviewed. No pertinent family history.  Social History   Socioeconomic History   Marital status: Single    Spouse name: Not on file   Number of children: Not on file   Years of education: Not on file   Highest education level: Not on file  Occupational History   Not on file  Tobacco Use   Smoking status: Never   Smokeless tobacco: Never  Substance and Sexual Activity   Alcohol use: No   Drug use: No   Sexual activity: Not on file  Other Topics  Concern   Not on file  Social History Narrative   Not on file   Social Determinants of Health   Financial Resource Strain: Not on file  Food Insecurity: Not on file  Transportation Needs: Not on file  Physical Activity: Not on file  Stress: Not on file  Social Connections: Not on file  Intimate Partner Violence: Not on file     Physical Exam   Vitals:   12/05/22 2051 12/06/22 0145  BP: 131/82 117/78  Pulse: 72 61  Resp: 18 18  Temp: 98.4 F (36.9 C)   SpO2: 100% 98%    CONSTITUTIONAL: Well-appearing, NAD NEURO/PSYCH:  Alert and oriented x 3, no focal deficits EYES:  eyes equal and reactive ENT/NECK:  no LAD, no JVD CARDIO: Regular rate, well-perfused, normal S1 and S2 PULM:  CTAB no wheezing or rhonchi GI/GU:  non-distended, non-tender MSK/SPINE:  No gross deformities, no edema SKIN:  no rash, erythema and small serosanguineous drainage from the umbilicus   *Additional and/or pertinent findings included in MDM below  Diagnostic and Interventional Summary    EKG Interpretation  Date/Time:    Ventricular Rate:    PR Interval:    QRS Duration:   QT Interval:    QTC Calculation:   R Axis:     Text Interpretation:         Labs  Reviewed  CBC - Abnormal; Notable for the following components:      Result Value   RBC 5.30 (*)    Hemoglobin 11.9 (*)    MCV 72.1 (*)    MCH 22.5 (*)    All other components within normal limits  COMPREHENSIVE METABOLIC PANEL - Abnormal; Notable for the following components:   Glucose, Bld 126 (*)    All other components within normal limits    CT ABDOMEN PELVIS W CONTRAST  Final Result      Medications  amoxicillin-clavulanate (AUGMENTIN) 875-125 MG per tablet 1 tablet (has no administration in time range)  iohexol (OMNIPAQUE) 300 MG/ML solution 100 mL (100 mLs Intravenous Contrast Given 12/06/22 0101)  lidocaine (PF) (XYLOCAINE) 1 % injection 10 mL (10 mLs Infiltration Given by Other 12/06/22 0149)     Procedures  /   Critical Care .Marland KitchenIncision and Drainage  Date/Time: 12/06/2022 2:14 AM  Performed by: Sabas Sous, MD Authorized by: Sabas Sous, MD   Consent:    Consent obtained:  Verbal   Consent given by:  Patient   Risks, benefits, and alternatives were discussed: yes     Risks discussed:  Bleeding, damage to other organs, infection, incomplete drainage and pain Universal protocol:    Procedure explained and questions answered to patient or proxy's satisfaction: yes     Immediately prior to procedure, a time out was called: yes     Patient identity confirmed:  Verbally with patient Location:    Type:  Abscess   Size:  1.5   Location:  Trunk   Trunk location:  Abdomen Pre-procedure details:    Skin preparation:  Chlorhexidine with alcohol Sedation:    Sedation type:  None Anesthesia:    Anesthesia method:  Local infiltration   Local anesthetic:  Lidocaine 1% w/o epi Procedure type:    Complexity:  Complex Procedure details:    Incision types:  Single straight   Incision depth:  Subcutaneous   Wound management:  Probed and deloculated, irrigated with saline and debrided   Drainage:  Bloody   Drainage amount:  Scant   Wound treatment:  Wound left open   Packing materials:  None Post-procedure details:    Procedure completion:  Tolerated well, no immediate complications Comments:     Initial incision at the most fluctuant site overall did not produce much purulence.  This area was deloculated and rinsed.  A second incision was done at a different location to ensure that we were not leaving behind any purulence.  Overall I suspect most of the purulent material had already drained according to patient's provided history.   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  There is some surrounding erythema and so cellulitis is also favored.  The depth of the infection is unclear and  patient has a complex surgical history.  Would not want to do an incision and drainage if patient has developed an umbilical hernia.  And so we will obtain CT imaging to further delineate.  Past medical/surgical history that increases complexity of ED encounter: History of perforated appendicitis status post exploratory laparotomy in 2013  Interpretation of Diagnostics I personally reviewed the laboratory assessment and my interpretation is as follows: No significant blood count or electrolyte disturbance  Small superficial abscess noted on CT, no other complicating features.  Patient does have a left ovarian cyst that she is aware of,  she says it was may be only 4 cm back in April when she had an ultrasound, she will follow-up with her OB.  Patient Reassessment and Ultimate Disposition/Management     Incision and drainage as described above, appropriate for discharge with return precautions.  Patient management required discussion with the following services or consulting groups:  None  Complexity of Problems Addressed Acute illness or injury that poses threat of life of bodily function  Additional Data Reviewed and Analyzed Further history obtained from: Further history from spouse/family member  Additional Factors Impacting ED Encounter Risk Prescriptions and Minor Procedures  Barth Kirks. Sedonia Small, Bouse [email or web address]  Final Clinical Impressions(s) / ED Diagnoses     ICD-10-CM   1. Umbilical pain  Z76.73       ED Discharge Orders          Ordered    amoxicillin-clavulanate (AUGMENTIN) 875-125 MG tablet  Every 12 hours        12/06/22 0211             Discharge Instructions Discussed with and Provided to Patient:     Discharge Instructions      You were evaluated in the Emergency Department and after careful evaluation, we did not find any emergent condition requiring admission or further testing in the  hospital.  Your exam/testing today was overall reassuring.  Symptoms seem to be due to a skin infection and small abscess near your bellybutton.  We did a drainage procedure here in the emergency department.  Take the Augmentin antibiotic as directed to help clear the infection.  Keep a close eye on the area.  We also discussed your left ovarian cyst seen on CT, 5 cm x 6 cm in size.  Recommend discussing this with your OB/GYN and discussing the need for repeat ultrasound.  Please return to the Emergency Department if you experience any worsening of your condition.  Thank you for allowing [place] to be a part of your care.        Maudie Flakes, MD 12/06/22 [phone]

## 2022-12-06 ENCOUNTER — Emergency Department (HOSPITAL_BASED_OUTPATIENT_CLINIC_OR_DEPARTMENT_OTHER): Payer: BC Managed Care – PPO

## 2022-12-06 LAB — COMPREHENSIVE METABOLIC PANEL
ALT: 11 U/L (ref 0–44)
AST: 15 U/L (ref 15–41)
Albumin: 4 g/dL (ref 3.5–5.0)
Alkaline Phosphatase: 53 U/L (ref 38–126)
Anion gap: 9 (ref 5–15)
BUN: 12 mg/dL (ref 6–20)
CO2: 25 mmol/L (ref 22–32)
Calcium: 9.4 mg/dL (ref 8.9–10.3)
Chloride: 101 mmol/L (ref 98–111)
Creatinine, Ser: 0.66 mg/dL (ref 0.44–1.00)
GFR, Estimated: >60 mL/min (ref >60)
Glucose, Bld: 126 mg/dL — ABNORMAL HIGH (ref 70–99)
Potassium: 3.6 mmol/L (ref 3.5–5.1)
Sodium: 135 mmol/L (ref 135–145)
Total Bilirubin: 0.4 mg/dL (ref 0.3–1.2)
Total Protein: 7.5 g/dL (ref 6.5–8.1)

## 2022-12-06 LAB — CBC
HCT: 38.2 % (ref 36.0–46.0)
Hemoglobin: 11.9 g/dL — ABNORMAL LOW (ref 12.0–15.0)
MCH: 22.5 pg — ABNORMAL LOW (ref 26.0–34.0)
MCHC: 31.2 g/dL (ref 30.0–36.0)
MCV: 72.1 fL — ABNORMAL LOW (ref 80.0–100.0)
Platelets: 302 10*3/uL (ref 150–400)
RBC: 5.3 MIL/uL — ABNORMAL HIGH (ref 3.87–5.11)
RDW: 14.7 % (ref 11.5–15.5)
WBC: 9 10*3/uL (ref 4.0–10.5)
nRBC: 0 % (ref 0.0–0.2)

## 2022-12-06 MED ORDER — AMOXICILLIN-POT CLAVULANATE 875-125 MG PO TABS: 1.0000 | ORAL_TABLET | Freq: Two times a day (BID) | ORAL | 0 refills | Status: AC

## 2022-12-06 MED ORDER — IOHEXOL 300 MG/ML  SOLN
100.0000 mL | Freq: Once | INTRAMUSCULAR | Status: AC | PRN
  Administered 2022-12-06: 100 mL via INTRAVENOUS

## 2022-12-06 MED ORDER — LIDOCAINE HCL (PF) 1 % IJ SOLN
10.0000 mL | Freq: Once | INTRAMUSCULAR | Status: AC
  Administered 2022-12-06: 10 mL
  Filled 2022-12-06: qty 10

## 2022-12-06 MED ORDER — AMOXICILLIN-POT CLAVULANATE 875-125 MG PO TABS
1.0000 | ORAL_TABLET | Freq: Once | ORAL | Status: AC
  Administered 2022-12-06: 1 via ORAL
  Filled 2022-12-06: qty 1

## 2022-12-06 NOTE — ED Notes (Signed)
Patient transported to CT 

## 2022-12-06 NOTE — Discharge Instructions (Signed)
You were evaluated in the Emergency Department and after careful evaluation, we did not find any emergent condition requiring admission or further testing in the hospital.  Your exam/testing today was overall reassuring.  Symptoms seem to be due to a skin infection and small abscess near your bellybutton.  We did a drainage procedure here in the emergency department.  Take the Augmentin antibiotic as directed to help clear the infection.  Keep a close eye on the area.  We also discussed your left ovarian cyst seen on CT, 5 cm x 6 cm in size.  Recommend discussing this with your OB/GYN and discussing the need for repeat ultrasound.  Please return to the Emergency Department if you experience any worsening of your condition.  Thank you for allowing [place] to be a part of your care.

## 2024-06-16 ENCOUNTER — Ambulatory Visit

## 2024-06-16 ENCOUNTER — Ambulatory Visit
Admission: EM | Admit: 2024-06-16 | Discharge: 2024-06-16 | Disposition: A | Discharge status: Home / Self Care | Attending: Family Medicine | Admitting: Family Medicine

## 2024-06-16 DIAGNOSIS — S99912A Unspecified injury of left ankle, initial encounter: Secondary | ICD-10-CM | POA: Diagnosis not present

## 2024-06-16 DIAGNOSIS — S8992XA Unspecified injury of left lower leg, initial encounter: Secondary | ICD-10-CM

## 2024-06-16 DIAGNOSIS — M25572 Pain in left ankle and joints of left foot: Secondary | ICD-10-CM

## 2024-06-16 DIAGNOSIS — S8262XA Displaced fracture of lateral malleolus of left fibula, initial encounter for closed fracture: Secondary | ICD-10-CM | POA: Diagnosis not present

## 2024-06-16 MED ORDER — OXYCODONE-ACETAMINOPHEN 5-325 MG PO TABS: 1.0000 | ORAL_TABLET | Freq: Four times a day (QID) | ORAL | 0 refills | Status: AC | PRN

## 2024-06-16 MED ORDER — IBUPROFEN 800 MG PO TABS: 800.0000 mg | ORAL_TABLET | Freq: Every day | ORAL | 0 refills | Status: AC | PRN

## 2024-06-16 NOTE — Discharge Instructions (Addendum)
 Advised patient of left ankle x-ray results.  Advised patient to wear left ankle cam walker boot 24/7 until being evaluated by your PCP or [facility].  Advised patient may take ibuprofen  daily or as needed for left ankle pain.  Advised may use Percocet for acute/severe breakthrough left ankle pain.  Patient advised of sedative effects of this medication.  Encouraged to increase daily water intake to 64 ounces per day while taking these medications.

## 2024-06-16 NOTE — ED Provider Notes (Signed)
 [facility]    CSN: [id number] Arrival date & time: 06/16/24  1101      History   Chief Complaint Chief Complaint  Patient presents with   left ankle pain     HPI Chinenye Ambrosino is a 38 y.o. female.   HPI Pleasant 38 year old female presents with a left ankle injury secondary to playing volleyball last night.  Patient is currently using crutches as she is unable to bear weight on left ankle.  PMH significant for obesity, renal sufficiency, and anemia  Past Medical History:  Diagnosis Date   Anemia     Patient Active Problem List   Diagnosis Date Noted   Sinus bradycardia    Abnormal ECG    Obesity (BMI 30-39.9) 08/12/2020   Pneumonia due to COVID-19 virus 08/11/2020   Acute respiratory failure due to COVID-19 [facility]) 08/11/2020   Appendicitis, acute 02/25/2012   Vaginal bleeding 02/14/2012   Renal insufficiency 02/14/2012   Pleural effusion 02/14/2012   Anemia 02/10/2012   Ileus following gastrointestinal surgery (HCC) 02/03/2012    Past Surgical History:  Procedure Laterality Date   APPENDECTOMY     APPLICATION OF WOUND VAC  01/29/2012   Procedure: APPLICATION OF WOUND VAC;  Surgeon: Deward GORMAN Curvin DOUGLAS, MD;  Location: [facility];  Service: General;;   LAPAROSCOPY  01/29/2012   Procedure: LAPAROSCOPY DIAGNOSTIC;  Surgeon: Deward GORMAN Curvin DOUGLAS, MD;  Location: [facility];  Service: General;  Laterality: N/A;   LAPAROTOMY  01/29/2012   Procedure: EXPLORATORY LAPAROTOMY;  Surgeon: Deward GORMAN Curvin DOUGLAS, MD;  Location: [facility];  Service: General;  Laterality: N/A;  OPEN RIGHT COLECTOMY ,TERMINAL ILEUM AND APPENDECTOMY    OB History   No obstetric history on file.      Home Medications    Prior to Admission medications   Medication Sig Start Date End Date Taking? Authorizing Provider  ibuprofen  (ADVIL ) 800 MG tablet Take 1 tablet (800 mg total) by mouth daily as needed. 06/16/24  Yes Kathee Tumlin, FNP  oxyCODONE -acetaminophen  (PERCOCET/ROXICET) 5-325 MG tablet Take 1 tablet  by mouth every 6 (six) hours as needed for severe pain (pain score 7-10). 06/16/24  Yes Teddy Sharper, FNP  albuterol  (VENTOLIN  HFA) 108 (90 Base) MCG/ACT inhaler Inhale 2 puffs into the lungs every 4 (four) hours as needed for wheezing or shortness of breath.  08/09/20   [provider]  calcium -vitamin D  250-100 MG-UNIT per tablet Take 1 tablet by mouth 2 (two) times daily.    [provider]  ferrous fumarate  (HEMOCYTE - 106 MG FE) 325 (106 FE) MG TABS Take 1 tablet (106 mg of iron  total) by mouth at bedtime. 02/19/12   Tonnie George, PA-C  Multiple Vitamins-Iron  (MULTIVITAMINS WITH IRON ) TABS Take 1 tablet by mouth daily. 02/19/12   Tonnie George, PA-C    Family History History reviewed. No pertinent family history.  Social History Social History   Tobacco Use   Smoking status: Never   Smokeless tobacco: Never  Substance Use Topics   Alcohol use: No   Drug use: No     Allergies   Sulfa antibiotics   Review of Systems Review of Systems   Physical Exam Triage Vital Signs ED Triage Vitals  Encounter Vitals Group     BP 06/16/24 1134 132/84     Girls Systolic BP Percentile --      Girls Diastolic BP Percentile --      Boys Systolic BP Percentile --      Boys  Diastolic BP Percentile --      Pulse Rate 06/16/24 1134 (!) 53     Resp 06/16/24 1134 19     Temp 06/16/24 1134 98.6 F (37 C)     Temp src --      SpO2 06/16/24 1134 98 %     Weight --      Height --      Head Circumference --      Peak Flow --      Pain Score 06/16/24 1132 10     Pain Loc --      Pain Education --      Exclude from Growth Chart --    No data found.  Updated Vital Signs BP 132/84   Pulse (!) 53   Temp 98.6 F (37 C)   Resp 19   LMP 05/21/2024   SpO2 98%   Visual Acuity Right Eye Distance:   Left Eye Distance:   Bilateral Distance:    Right Eye Near:   Left Eye Near:    Bilateral Near:     Physical Exam Vitals and nursing note reviewed.   Constitutional:      Appearance: Normal appearance. She is normal weight.  HENT:     Head: Normocephalic and atraumatic.     Mouth/Throat:     Mouth: Mucous membranes are moist.     Pharynx: Oropharynx is clear.  Eyes:     Extraocular Movements: Extraocular movements intact.     Conjunctiva/sclera: Conjunctivae normal.     Pupils: Pupils are equal, round, and reactive to light.  Cardiovascular:     Pulses: Normal pulses.     Heart sounds: Normal heart sounds.  Pulmonary:     Effort: Pulmonary effort is normal.     Breath sounds: Normal breath sounds. No wheezing, rhonchi or rales.  Musculoskeletal:        General: Normal range of motion.     Comments: Left ankle: TTP over lateral malleolus with significant soft tissue swelling, patient reports majority of pain is posteriorly over Achilles insertion site; exam limited due to pain today unable to flex or extend  Skin:    General: Skin is warm and dry.  Neurological:     General: No focal deficit present.     Mental Status: She is alert and oriented to person, place, and time.  Psychiatric:        Mood and Affect: Mood normal.        Behavior: Behavior normal.        Thought Content: Thought content normal.      UC Treatments / Results  Labs (all labs ordered are listed, but only abnormal results are displayed) Labs Reviewed - No data to display  EKG   Radiology DG Ankle Complete Left Result Date: 06/16/2024 CLINICAL DATA:  Injury. EXAM: LEFT ANKLE COMPLETE - 3+ VIEW COMPARISON:  None Available. FINDINGS: There is subtle faint curvilinear ossific density overlying the tip of the lateral malleolus. However, no discrete donor site defect noted. Findings are concerning for small avulsion fracture. Correlate clinically for point tenderness. There is? Mild overlying soft tissue edema/swelling. No other acute fracture or dislocation. No aggressive osseous lesion. Ankle mortise appears intact. No significant arthritis of imaged  joints. A tiny calcaneal spur noted along the Achilles tendon attachment site. No radiopaque foreign bodies. IMPRESSION: *Findings concerning for small avulsion fracture of the tip of the lateral malleolus. Correlate clinically with point tenderness. Electronically Signed   By: Ree  Kimberlee M.D.   On: 06/16/2024 12:00    Procedures Procedures (including critical care time)  Medications Ordered in UC Medications - No data to display  Initial Impression / Assessment and Plan / UC Course  I have reviewed the triage vital signs and the nursing notes.  Pertinent labs & imaging results that were available during my care of the patient were reviewed by me and considered in my medical decision making (see chart for details).     MDM: 1.  Closed fracture of proximal lateral malleolus of left fibula, initial encounter-left ankle x-ray results revealed above patient advised.  Patient placed in cam walker boot prior to discharge with specific instructions to wear boot 24/7 until being evaluated by [facility].  Rx'd Percocet 5/325 mg tablet: Take 1 tablet every 6 hours, as needed for pain; 2.  Injury of left ankle, initial encounter-Rx'd Ibuprofen  800 mg tablet take 1 tab daily, PRN; 3.  Acute left ankle pain-Rx'd ibuprofen  800 mg tablet: Take 1 tablet daily, as needed. Advised patient of left ankle x-ray results.  Advised patient to wear left ankle cam walker boot 24/7 until being evaluated by your PCP or [facility].  Advised patient may take ibuprofen  daily or as needed for left ankle pain.  Advised may use Percocet for acute/severe breakthrough left ankle pain.  Patient advised of sedative effects of this medication.  Encouraged to increase daily water intake to 64 ounces per day while taking these medications. Final Clinical Impressions(s) / UC Diagnoses   Final diagnoses:  Acute left ankle pain  Injury of left ankle, initial encounter  Closed fracture of proximal lateral  malleolus of left fibula, initial encounter     Discharge Instructions      Advised patient of left ankle x-ray results.  Advised patient to wear left ankle cam walker boot 24/7 until being evaluated by your PCP or  orthopedics.  Advised patient may take ibuprofen  daily or as needed for left ankle pain.  Advised may use Percocet for acute/severe breakthrough left ankle pain.  Patient advised of sedative effects of this medication.  Encouraged to increase daily water intake to 64 ounces per day while taking these medications.     ED Prescriptions     Medication Sig Dispense Auth. Provider   ibuprofen  (ADVIL ) 800 MG tablet Take 1 tablet (800 mg total) by mouth daily as needed. 21 tablet Linnet Bottari, FNP   oxyCODONE -acetaminophen  (PERCOCET/ROXICET) 5-325 MG tablet Take 1 tablet by mouth every 6 (six) hours as needed for severe pain (pain score 7-10). 15 tablet Kasumi Ditullio, FNP      I have reviewed the PDMP during this encounter.   Teddy Sharper, FNP 06/16/24 1236

## 2024-06-16 NOTE — ED Triage Notes (Signed)
 Pt presents to uc with co left ankle injury last night while playing volley ball  pt was diging for the ball and had a sensation of a hammer hitting her in the back of her ankle causing her to fall. Pt has not been able to walk since. Has crutches and has been using ice.
